# Patient Record
Sex: Female | Born: 1975 | Race: Black or African American | Hispanic: No | Marital: Married | Smoking: Never smoker
Health system: Southern US, Community
[De-identification: ages and names within clinical notes are randomized; demographics above are authoritative.]

## PROBLEM LIST (undated history)

## (undated) DIAGNOSIS — R0602 Shortness of breath: Secondary | ICD-10-CM

## (undated) DIAGNOSIS — M2550 Pain in unspecified joint: Secondary | ICD-10-CM

## (undated) DIAGNOSIS — G43909 Migraine, unspecified, not intractable, without status migrainosus: Secondary | ICD-10-CM

## (undated) DIAGNOSIS — E7800 Pure hypercholesterolemia, unspecified: Secondary | ICD-10-CM

## (undated) DIAGNOSIS — E119 Type 2 diabetes mellitus without complications: Secondary | ICD-10-CM

## (undated) DIAGNOSIS — T7840XA Allergy, unspecified, initial encounter: Secondary | ICD-10-CM

## (undated) DIAGNOSIS — Z87442 Personal history of urinary calculi: Secondary | ICD-10-CM

## (undated) DIAGNOSIS — M25561 Pain in right knee: Secondary | ICD-10-CM

## (undated) DIAGNOSIS — J189 Pneumonia, unspecified organism: Secondary | ICD-10-CM

## (undated) DIAGNOSIS — J45909 Unspecified asthma, uncomplicated: Secondary | ICD-10-CM

## (undated) DIAGNOSIS — R079 Chest pain, unspecified: Secondary | ICD-10-CM

## (undated) DIAGNOSIS — I10 Essential (primary) hypertension: Secondary | ICD-10-CM

## (undated) DIAGNOSIS — M549 Dorsalgia, unspecified: Secondary | ICD-10-CM

## (undated) DIAGNOSIS — K219 Gastro-esophageal reflux disease without esophagitis: Secondary | ICD-10-CM

## (undated) DIAGNOSIS — F319 Bipolar disorder, unspecified: Secondary | ICD-10-CM

## (undated) DIAGNOSIS — F32A Depression, unspecified: Secondary | ICD-10-CM

## (undated) DIAGNOSIS — F419 Anxiety disorder, unspecified: Secondary | ICD-10-CM

## (undated) DIAGNOSIS — E785 Hyperlipidemia, unspecified: Secondary | ICD-10-CM

## (undated) DIAGNOSIS — K5900 Constipation, unspecified: Secondary | ICD-10-CM

## (undated) DIAGNOSIS — E669 Obesity, unspecified: Secondary | ICD-10-CM

## (undated) HISTORY — DX: Anxiety disorder, unspecified: F41.9

## (undated) HISTORY — DX: Shortness of breath: R06.02

## (undated) HISTORY — DX: Dorsalgia, unspecified: M54.9

## (undated) HISTORY — DX: Pain in right knee: M25.561

## (undated) HISTORY — DX: Pure hypercholesterolemia, unspecified: E78.00

## (undated) HISTORY — DX: Pain in unspecified joint: M25.50

## (undated) HISTORY — DX: Migraine, unspecified, not intractable, without status migrainosus: G43.909

## (undated) HISTORY — DX: Bipolar disorder, unspecified: F31.9

## (undated) HISTORY — DX: Gastro-esophageal reflux disease without esophagitis: K21.9

## (undated) HISTORY — DX: Constipation, unspecified: K59.00

## (undated) HISTORY — PX: OTHER SURGICAL HISTORY: SHX169

## (undated) HISTORY — PX: MOUTH SURGERY: SHX715

## (undated) HISTORY — DX: Chest pain, unspecified: R07.9

## (undated) HISTORY — PX: UPPER GASTROINTESTINAL ENDOSCOPY: SHX188

## (undated) HISTORY — DX: Type 2 diabetes mellitus without complications: E11.9

## (undated) HISTORY — PX: PARTIAL HYSTERECTOMY: SHX80

## (undated) HISTORY — DX: Hyperlipidemia, unspecified: E78.5

## (undated) HISTORY — DX: Depression, unspecified: F32.A

## (undated) HISTORY — DX: Allergy, unspecified, initial encounter: T78.40XA

## (undated) HISTORY — PX: COLONOSCOPY: SHX174

## (undated) HISTORY — DX: Essential (primary) hypertension: I10

---

## 2004-04-02 ENCOUNTER — Other Ambulatory Visit: Payer: Self-pay

## 2004-04-02 ENCOUNTER — Emergency Department: Payer: Self-pay | Admitting: Emergency Medicine

## 2004-07-08 ENCOUNTER — Ambulatory Visit: Payer: Self-pay

## 2004-08-02 ENCOUNTER — Emergency Department: Payer: Self-pay | Admitting: Internal Medicine

## 2005-02-13 ENCOUNTER — Emergency Department: Payer: Self-pay | Admitting: Emergency Medicine

## 2005-04-01 ENCOUNTER — Emergency Department: Payer: Self-pay | Admitting: Emergency Medicine

## 2005-07-19 ENCOUNTER — Emergency Department: Payer: Self-pay | Admitting: Unknown Physician Specialty

## 2005-10-31 ENCOUNTER — Emergency Department: Payer: Self-pay | Admitting: Unknown Physician Specialty

## 2006-10-03 ENCOUNTER — Emergency Department: Payer: Self-pay | Admitting: Emergency Medicine

## 2007-01-25 ENCOUNTER — Emergency Department: Payer: Self-pay | Admitting: Emergency Medicine

## 2007-07-11 ENCOUNTER — Emergency Department: Payer: Self-pay | Admitting: Emergency Medicine

## 2007-10-01 ENCOUNTER — Emergency Department: Payer: Self-pay | Admitting: Emergency Medicine

## 2007-10-04 ENCOUNTER — Emergency Department: Payer: Self-pay | Admitting: Emergency Medicine

## 2007-10-04 ENCOUNTER — Other Ambulatory Visit: Payer: Self-pay

## 2008-01-08 ENCOUNTER — Emergency Department: Payer: Self-pay | Admitting: Emergency Medicine

## 2009-08-09 ENCOUNTER — Emergency Department (HOSPITAL_COMMUNITY): Admission: EM | Admit: 2009-08-09 | Discharge: 2009-08-09 | Payer: Self-pay | Admitting: Emergency Medicine

## 2010-08-22 ENCOUNTER — Emergency Department: Payer: Self-pay | Admitting: Unknown Physician Specialty

## 2010-12-11 ENCOUNTER — Emergency Department: Payer: Self-pay | Admitting: Emergency Medicine

## 2011-01-16 ENCOUNTER — Emergency Department: Payer: Self-pay | Admitting: Emergency Medicine

## 2012-06-08 ENCOUNTER — Emergency Department: Payer: Self-pay | Admitting: Emergency Medicine

## 2012-12-16 ENCOUNTER — Emergency Department: Payer: Self-pay | Admitting: Emergency Medicine

## 2013-02-04 ENCOUNTER — Emergency Department: Payer: Self-pay | Admitting: Emergency Medicine

## 2013-05-10 ENCOUNTER — Encounter (HOSPITAL_COMMUNITY): Payer: Self-pay | Admitting: Emergency Medicine

## 2013-05-10 ENCOUNTER — Emergency Department (HOSPITAL_COMMUNITY)
Admission: EM | Admit: 2013-05-10 | Discharge: 2013-05-10 | Disposition: A | Discharge status: Home / Self Care | Payer: Self-pay | Attending: Emergency Medicine | Admitting: Emergency Medicine

## 2013-05-10 ENCOUNTER — Emergency Department (HOSPITAL_COMMUNITY): Payer: Self-pay

## 2013-05-10 DIAGNOSIS — I10 Essential (primary) hypertension: Secondary | ICD-10-CM | POA: Insufficient documentation

## 2013-05-10 DIAGNOSIS — Z79899 Other long term (current) drug therapy: Secondary | ICD-10-CM | POA: Insufficient documentation

## 2013-05-10 DIAGNOSIS — Z91040 Latex allergy status: Secondary | ICD-10-CM | POA: Insufficient documentation

## 2013-05-10 DIAGNOSIS — IMO0002 Reserved for concepts with insufficient information to code with codable children: Secondary | ICD-10-CM | POA: Insufficient documentation

## 2013-05-10 DIAGNOSIS — J45901 Unspecified asthma with (acute) exacerbation: Secondary | ICD-10-CM | POA: Insufficient documentation

## 2013-05-10 HISTORY — DX: Unspecified asthma, uncomplicated: J45.909

## 2013-05-10 HISTORY — DX: Essential (primary) hypertension: I10

## 2013-05-10 MED ORDER — DEXAMETHASONE SODIUM PHOSPHATE 10 MG/ML IJ SOLN
10.0000 mg | Freq: Once | INTRAMUSCULAR | Status: AC
  Administered 2013-05-10: 10 mg via INTRAMUSCULAR
  Filled 2013-05-10: qty 1

## 2013-05-10 MED ORDER — ALBUTEROL SULFATE (2.5 MG/3ML) 0.083% IN NEBU
5.0000 mg | INHALATION_SOLUTION | Freq: Once | RESPIRATORY_TRACT | Status: AC
  Administered 2013-05-10: 5 mg via RESPIRATORY_TRACT
  Filled 2013-05-10: qty 6

## 2013-05-10 MED ORDER — ALBUTEROL SULFATE (2.5 MG/3ML) 0.083% IN NEBU
2.5000 mg | INHALATION_SOLUTION | Freq: Four times a day (QID) | RESPIRATORY_TRACT | Status: DC | PRN
Start: 2013-05-10 — End: 2020-07-09

## 2013-05-10 MED ORDER — IPRATROPIUM BROMIDE 0.02 % IN SOLN
0.5000 mg | Freq: Once | RESPIRATORY_TRACT | Status: AC
  Administered 2013-05-10: 0.5 mg via RESPIRATORY_TRACT
  Filled 2013-05-10: qty 2.5

## 2013-05-10 MED ORDER — PREDNISONE 20 MG PO TABS: 40.0000 mg | ORAL_TABLET | Freq: Every day | ORAL | Status: DC

## 2013-05-10 MED ORDER — ALBUTEROL SULFATE HFA 108 (90 BASE) MCG/ACT IN AERS
2.0000 | INHALATION_SPRAY | RESPIRATORY_TRACT | Status: DC | PRN
  Administered 2013-05-10: 2 via RESPIRATORY_TRACT
  Filled 2013-05-10: qty 6.7

## 2013-05-10 NOTE — ED Provider Notes (Signed)
Medical screening examination/treatment/procedure(s) were performed by non-physician practitioner and as supervising physician I was immediately available for consultation/collaboration.     Leonidus Rowand, MD 05/10/13 2341 

## 2013-05-10 NOTE — ED Provider Notes (Signed)
CSN: [id number]     Arrival date & time 05/10/13  1533 History  This chart was scribed for non-physician practitioner Junius FinnerErin O'Malley, PA-C working with Geoffery Lyonsouglas Delo, MD by Joaquin MusicKristina Sanchez-Matthews, ED Scribe. This patient was seen in room TR09C/TR09C and the patient's care was started at 7:11 PM .   Chief Complaint  Patient presents with  . Shortness of Breath   The history is provided by the patient. No language interpreter was used.   HPI Comments: Gloria Kim is a 38 y.o. female with a hx of asthma who presents to the Emergency Department complaining of SOB that began earlier today . Pt states she generally is SOB when the weather changes and states this happens yearly. She states she has been having a dry cough with little sputum. Pt states she is currently out of her nebulizer medication but states she has been using ProAir without relief. She reports having swelling in her L foot.Pt denies hx of blood clots. States she was evaluated last month with blood tests and ultrasound for blood clots in left leg but everything came back normal. [facility]. Pt denies nausea, vomiting, and diarrhea. Denies sick contacts or recent travel.   Past Medical History  Diagnosis Date  . Asthma   . Hypertension    History reviewed. No pertinent past surgical history. No family history on file. History  Substance Use Topics  . Smoking status: Never Smoker   . Smokeless tobacco: Not on file  . Alcohol Use: Yes   OB History   Grav Para Term Preterm Abortions TAB SAB Ect Mult Living                 Review of Systems  Respiratory: Positive for cough and shortness of breath.   Gastrointestinal: Negative for nausea, vomiting and diarrhea.  All other systems reviewed and are negative.   Allergies  Latex  Home Medications   Current Outpatient Rx  Name  Route  Sig  Dispense  Refill  . albuterol (PROVENTIL HFA;VENTOLIN HFA) 108 (90 BASE) MCG/ACT inhaler   Inhalation  Inhale 2 puffs into the lungs every 6 (six) hours as needed for wheezing or shortness of breath.         . lisinopril (PRINIVIL,ZESTRIL) 5 MG tablet   Oral   Take 5 mg by mouth daily.         Marland Kitchen. albuterol (PROVENTIL) (2.5 MG/3ML) 0.083% nebulizer solution   Nebulization   Take 3 mLs (2.5 mg total) by nebulization every 6 (six) hours as needed for wheezing or shortness of breath.   75 mL   12   . predniSONE (DELTASONE) 20 MG tablet   Oral   Take 2 tablets (40 mg total) by mouth daily.   10 tablet   0     BP 150/83  Pulse 106  Temp(Src) 98.3 F (36.8 C) (Oral)  Resp 20  SpO2 96%  LMP 05/10/2013  Physical Exam  Nursing note and vitals reviewed. Constitutional: She appears well-developed and well-nourished. No distress.  HENT:  Head: Normocephalic and atraumatic.  Eyes: Conjunctivae are normal. No scleral icterus.  Neck: Normal range of motion.  Cardiovascular: Normal rate, regular rhythm and normal heart sounds.   Pulmonary/Chest: Effort normal. No respiratory distress. She has wheezes. She has no rales. She exhibits no tenderness.  Diffused wheezes greater in the L than the R. No respiratory distress. Able to speak in full sentences.  Abdominal: Soft. Bowel sounds are normal. She  exhibits no distension and no mass. There is no tenderness. There is no rebound and no guarding.  Musculoskeletal: Normal range of motion.  Neurological: She is alert.  Skin: Skin is warm and dry. She is not diaphoretic.    ED Course  Procedures DIAGNOSTIC STUDIES: Oxygen Saturation is 95% on RA, normal by my interpretation.    COORDINATION OF CARE: 5:19 PM-Discussed treatment plan which includes X-ray pt. Will discharge pt with albuterol neb and prednisone. Pt agreed to plan.   5:53 PM-Evaluated breathing of pt and pt states she is feeling better after 2nd neb tx. Will discharge pt with medications. Pt agreed to plan.  Labs Review Labs Reviewed - No data to display Imaging Review Dg  Chest 2 View  05/10/2013   CLINICAL DATA:  Shortness of breath 1 week.  Asthma.  Chest pain.  EXAM: CHEST  2 VIEW  COMPARISON:  None.  FINDINGS: The heart size is exaggerated by low lung volumes. No focal airspace disease is evident. The visualized soft tissues and bony thorax are unremarkable.  IMPRESSION: 1. Low lung volumes. 2. No acute cardiopulmonary disease.   Electronically Signed   By: Gennette Pac M.D.   On: 05/10/2013 17:48    EKG Interpretation   None      MDM   1. Asthma exacerbation    Pt with hx of asthma exacerbation. Pt improved after 2 neb tx and decadron. CXR: unremarkable. WIll discharge home. Rx: prednisone and albuterol. Return precautions provided. Pt verbalized understanding and agreement with tx plan.   I personally performed the services described in this documentation, which was scribed in my presence. The recorded information has been reviewed and is accurate.    Junius Finner, PA-C 05/10/13 1914

## 2013-05-10 NOTE — ED Notes (Signed)
The pt is an asthmatic and has had sob for 7 days.  She has a hhn machine but she is out of the med for it.  She has been coughing and sneezing

## 2013-12-25 ENCOUNTER — Emergency Department (HOSPITAL_COMMUNITY)
Admission: EM | Admit: 2013-12-25 | Discharge: 2013-12-25 | Disposition: A | Discharge status: Home / Self Care | Payer: Self-pay | Attending: Emergency Medicine | Admitting: Emergency Medicine

## 2013-12-25 ENCOUNTER — Encounter (HOSPITAL_COMMUNITY): Payer: Self-pay | Admitting: Emergency Medicine

## 2013-12-25 ENCOUNTER — Emergency Department (HOSPITAL_COMMUNITY): Payer: Self-pay

## 2013-12-25 DIAGNOSIS — R0602 Shortness of breath: Secondary | ICD-10-CM | POA: Insufficient documentation

## 2013-12-25 DIAGNOSIS — J45901 Unspecified asthma with (acute) exacerbation: Secondary | ICD-10-CM | POA: Insufficient documentation

## 2013-12-25 DIAGNOSIS — J4521 Mild intermittent asthma with (acute) exacerbation: Secondary | ICD-10-CM

## 2013-12-25 DIAGNOSIS — Z91040 Latex allergy status: Secondary | ICD-10-CM | POA: Insufficient documentation

## 2013-12-25 DIAGNOSIS — Z79899 Other long term (current) drug therapy: Secondary | ICD-10-CM | POA: Insufficient documentation

## 2013-12-25 DIAGNOSIS — I10 Essential (primary) hypertension: Secondary | ICD-10-CM | POA: Insufficient documentation

## 2013-12-25 DIAGNOSIS — J189 Pneumonia, unspecified organism: Secondary | ICD-10-CM

## 2013-12-25 DIAGNOSIS — J159 Unspecified bacterial pneumonia: Secondary | ICD-10-CM | POA: Insufficient documentation

## 2013-12-25 MED ORDER — PREDNISONE 50 MG PO TABS: 50.0000 mg | ORAL_TABLET | Freq: Every day | ORAL | Status: DC

## 2013-12-25 MED ORDER — LIDOCAINE HCL (PF) 1 % IJ SOLN
5.0000 mL | Freq: Once | INTRAMUSCULAR | Status: AC
  Administered 2013-12-25: 2.1 mL
  Filled 2013-12-25: qty 5

## 2013-12-25 MED ORDER — AZITHROMYCIN 250 MG PO TABS
500.0000 mg | ORAL_TABLET | Freq: Once | ORAL | Status: AC
  Administered 2013-12-25: 500 mg via ORAL
  Filled 2013-12-25: qty 2

## 2013-12-25 MED ORDER — PREDNISONE 20 MG PO TABS
60.0000 mg | ORAL_TABLET | Freq: Once | ORAL | Status: AC
  Administered 2013-12-25: 60 mg via ORAL
  Filled 2013-12-25: qty 3

## 2013-12-25 MED ORDER — DOXYCYCLINE HYCLATE 100 MG PO CAPS: 100.0000 mg | ORAL_CAPSULE | Freq: Two times a day (BID) | ORAL | Status: AC

## 2013-12-25 MED ORDER — ALBUTEROL SULFATE HFA 108 (90 BASE) MCG/ACT IN AERS
6.0000 | INHALATION_SPRAY | Freq: Once | RESPIRATORY_TRACT | Status: AC
  Administered 2013-12-25: 6 via RESPIRATORY_TRACT
  Filled 2013-12-25: qty 6.7

## 2013-12-25 MED ORDER — ALBUTEROL SULFATE HFA 108 (90 BASE) MCG/ACT IN AERS: 2.0000 | INHALATION_SPRAY | Freq: Four times a day (QID) | RESPIRATORY_TRACT | Status: DC | PRN

## 2013-12-25 MED ORDER — CEFTRIAXONE SODIUM 1 G IJ SOLR
1.0000 g | Freq: Once | INTRAMUSCULAR | Status: AC
Start: 2013-12-25 — End: 2013-12-25
  Administered 2013-12-25: 1 g via INTRAMUSCULAR
  Filled 2013-12-25: qty 10

## 2013-12-25 NOTE — ED Notes (Signed)
Pt remains monitored by blood pressure, pulse ox, and 5 lead. Pts daughter remains at bedside.  

## 2013-12-25 NOTE — ED Notes (Signed)
The pt haS HAD COLD CHILLS SINCE THE WEEKEND  SHE IS AN ASTHMATIC AND SHE STARTED HAVING MORE DIFFICULTY BREATHING 2 DAYTS AGO WITH A COUGH ANS SHE THINKS SHE HAS HAD A TEMP.  PRODUCTIVE COUGH WHITE THIN.  LMP LAST MONTH

## 2013-12-25 NOTE — ED Provider Notes (Signed)
I saw and evaluated the patient, reviewed the resident's note and I agree with the findings and plan.   EKG Interpretation None     Area of the patient and find that she is in stable condition she does not have any acute respiratory distress. Her mental status is clear she is speaking easily in full sentences. She does have expiratory wheeze bilaterally. The patient does describe her history of asthma. Today however she has productive cough and pleuritic chest pain with fever. Chest x-ray does confirm a area suspicious for a focal pneumonia. She will be treated for outpatient community acquired pneumonia. Currently she does appear stable and without respiratory symptoms of significant asthma exacerbation but I think would necessitate any admission. The patient has been counseled however if there should be any worsening or changing in the severity or quality of her shortness of breath or symptoms she is to return for reevaluation. Impression Asthma Community acquired pneumonia  Arby Barrette, MD 12/25/13 2125

## 2013-12-25 NOTE — ED Provider Notes (Signed)
CSN: [id number]     Arrival date & time 12/25/13  1603 History   First MD Initiated Contact with Patient 12/25/13 1948     Chief Complaint  Patient presents with  . Shortness of Breath    Patient is a 38 y.o. female presenting with shortness of breath. The history is provided by the patient.  Shortness of Breath Severity:  Mild Onset quality:  Gradual Duration:  4 days Timing:  Constant Progression:  Worsening Chronicity:  New Context: activity   Context: not URI   Relieved by:  Nothing Worsened by:  Deep breathing Ineffective treatments:  Inhaler Associated symptoms: cough (new white productive sputum), fever (101 F orally at home), sputum production and wheezing   Associated symptoms: no abdominal pain, no chest pain, no diaphoresis, no hemoptysis, no sore throat, no syncope and no vomiting      Past Medical History  Diagnosis Date  . Asthma   . Hypertension    History reviewed. No pertinent past surgical history. No family history on file. History  Substance Use Topics  . Smoking status: Never Smoker   . Smokeless tobacco: Not on file  . Alcohol Use: Yes   OB History   Grav Para Term Preterm Abortions TAB SAB Ect Mult Living                 Review of Systems  Constitutional: Positive for fever (101 F orally at home). Negative for diaphoresis.  HENT: Negative for sore throat.   Respiratory: Positive for cough (new white productive sputum), sputum production, shortness of breath and wheezing. Negative for hemoptysis.   Cardiovascular: Negative for chest pain, leg swelling and syncope.  Gastrointestinal: Negative for vomiting and abdominal pain.  Musculoskeletal: Positive for myalgias. Negative for arthralgias and back pain.      Allergies  Latex  Home Medications   Prior to Admission medications   Medication Sig Start Date End Date Taking? Authorizing Provider  albuterol (PROVENTIL HFA;VENTOLIN HFA) 108 (90 BASE) MCG/ACT inhaler Inhale 2 puffs into the  lungs every 6 (six) hours as needed for wheezing or shortness of breath.   Yes Historical Provider, MD  albuterol (PROVENTIL) (2.5 MG/3ML) 0.083% nebulizer solution Take 3 mLs (2.5 mg total) by nebulization every 6 (six) hours as needed for wheezing or shortness of breath. 05/10/13  Yes Junius Finner, PA-C  lisinopril (PRINIVIL,ZESTRIL) 5 MG tablet Take 5 mg by mouth daily.   Yes Historical Provider, MD   BP 142/90  Pulse 106  Temp(Src) 97.1 F (36.2 C) (Oral)  Resp 22  Ht  (1.575 m)  Wt 275 lb (124.739 kg)  BMI 50.29 kg/m2  SpO2 96%  LMP 11/24/2013 Physical Exam  Nursing note and vitals reviewed. Constitutional: She is oriented to person, place, and time. She appears well-developed and well-nourished. No distress.  HENT:  Head: Normocephalic and atraumatic.  Nose: Nose normal.  Mouth/Throat: Oropharynx is clear and moist. No oropharyngeal exudate.  Eyes: Conjunctivae are normal. Pupils are equal, round, and reactive to light. No scleral icterus.  Neck: Normal range of motion. Neck supple. No tracheal deviation present.  Cardiovascular: Regular rhythm and normal heart sounds.   No murmur heard. 100 bpm  Pulmonary/Chest: Effort normal. No respiratory distress. She has wheezes (end exp at [facility]). She has rales (LLL).  Abdominal: Soft. Bowel sounds are normal. She exhibits no distension and no mass. There is no tenderness.  Musculoskeletal: Normal range of motion. She exhibits no edema.  No lower extremity edema, erythema,  warmth or TTP  Neurological: She is alert and oriented to person, place, and time.  Skin: Skin is warm and dry. No rash noted.  Psychiatric: She has a normal mood and affect.    ED Course  Procedures (including critical care time) Labs Review Labs Reviewed - No data to display  Imaging Review Dg Chest 2 View  12/25/2013   CLINICAL DATA:  Asthma, shortness of breath, cough  EXAM: CHEST  2 VIEW  COMPARISON:  05/10/2013  FINDINGS: Heart size upper normal to  mildly enlarged but stable. Vascular pattern within normal limits. Right lung is clear. On the left, there is a band of opacity in the lateral lower lobe which is new from the prior study. This is seen posteriorly on the lateral radiograph.  IMPRESSION: Focus of left lower lobe consolidation likely representing subsegmental atelectasis. Developing pneumonia is difficult to exclude. Correlate clinically and consider radiographic followup if indicated.   Electronically Signed   By: Esperanza Heir M.D.   On: 12/25/2013 17:43     EKG Interpretation None      MDM   Final diagnoses:  CAP (community acquired pneumonia)  Asthma, mild intermittent, with acute exacerbation    Known hx of asthma.  Not in resp distress. Not hypoxic. No signs of DVT, no hemoptysis. Primary diagnosis of asthma exacerbation complicated by infectious process more likely than PE.  VSS with exception of borderline tachycardia. Pt well appearing.   No hx of CAD, hx not consistent with ACS. Treat for outpatient CAP.  Given Rocephin and azithromycin here.  D/c with doxy 7 day course (LMP 1 month ago, pt refuses urine preg screen, pt understands risk says there is no way she is pregnant). Strict return precautions given. Pt expresses understanding. F/u with PCP in 2 days  Medications  albuterol (PROVENTIL HFA;VENTOLIN HFA) 108 (90 BASE) MCG/ACT inhaler 6 puff (6 puffs Inhalation Given 12/25/13 2013)  predniSONE (DELTASONE) tablet 60 mg (60 mg Oral Given 12/25/13 2050)  cefTRIAXone (ROCEPHIN) injection 1 g (1 g Intramuscular Given 12/25/13 2237)  azithromycin (ZITHROMAX) tablet 500 mg (500 mg Oral Given 12/25/13 2236)  lidocaine (PF) (XYLOCAINE) 1 % injection 5 mL (2.1 mLs Other Given 12/25/13 2237)     Sofie Rower, MD 12/25/13 2310

## 2013-12-25 NOTE — ED Notes (Signed)
Pt remains monitored by blood pressure, pulse ox, and 5 lead. Pts daughter remains at bedside.

## 2013-12-25 NOTE — ED Notes (Signed)
Patient discharged with all personal belongings. Escorted patient out without any incident.

## 2014-08-15 ENCOUNTER — Encounter (HOSPITAL_COMMUNITY): Payer: Self-pay | Admitting: Emergency Medicine

## 2014-08-15 ENCOUNTER — Emergency Department (HOSPITAL_COMMUNITY)
Admission: EM | Admit: 2014-08-15 | Discharge: 2014-08-16 | Disposition: A | Discharge status: Home / Self Care | Payer: Self-pay | Attending: Emergency Medicine | Admitting: Emergency Medicine

## 2014-08-15 DIAGNOSIS — Z91040 Latex allergy status: Secondary | ICD-10-CM | POA: Insufficient documentation

## 2014-08-15 DIAGNOSIS — J45909 Unspecified asthma, uncomplicated: Secondary | ICD-10-CM | POA: Insufficient documentation

## 2014-08-15 DIAGNOSIS — R197 Diarrhea, unspecified: Secondary | ICD-10-CM | POA: Insufficient documentation

## 2014-08-15 DIAGNOSIS — I10 Essential (primary) hypertension: Secondary | ICD-10-CM | POA: Insufficient documentation

## 2014-08-15 DIAGNOSIS — N12 Tubulo-interstitial nephritis, not specified as acute or chronic: Secondary | ICD-10-CM | POA: Insufficient documentation

## 2014-08-15 DIAGNOSIS — Z79899 Other long term (current) drug therapy: Secondary | ICD-10-CM | POA: Insufficient documentation

## 2014-08-15 DIAGNOSIS — E669 Obesity, unspecified: Secondary | ICD-10-CM | POA: Insufficient documentation

## 2014-08-15 DIAGNOSIS — Z7952 Long term (current) use of systemic steroids: Secondary | ICD-10-CM | POA: Insufficient documentation

## 2014-08-15 HISTORY — DX: Obesity, unspecified: E66.9

## 2014-08-15 LAB — COMPREHENSIVE METABOLIC PANEL
ALBUMIN: 3.3 g/dL — AB (ref 3.5–5.2)
ALK PHOS: 69 U/L (ref 39–117)
ALT: 20 U/L (ref 0–35)
AST: 24 U/L (ref 0–37)
Anion gap: 10 (ref 5–15)
BUN: 10 mg/dL (ref 6–23)
CO2: 27 mmol/L (ref 19–32)
Calcium: 9.1 mg/dL (ref 8.4–10.5)
Chloride: 102 mmol/L (ref 96–112)
Creatinine, Ser: 0.83 mg/dL (ref 0.50–1.10)
GFR calc Af Amer: >90 mL/min (ref >90)
GFR, EST NON AFRICAN AMERICAN: 88 mL/min — AB (ref >90)
GLUCOSE: 196 mg/dL — AB (ref 70–99)
POTASSIUM: 4.3 mmol/L (ref 3.5–5.1)
SODIUM: 139 mmol/L (ref 135–145)
Total Bilirubin: 0.6 mg/dL (ref 0.3–1.2)
Total Protein: 7 g/dL (ref 6.0–8.3)

## 2014-08-15 LAB — URINALYSIS, ROUTINE W REFLEX MICROSCOPIC
GLUCOSE, UA: NEGATIVE mg/dL
Ketones, ur: 15 mg/dL — AB
Nitrite: POSITIVE — AB
Protein, ur: NEGATIVE mg/dL
Specific Gravity, Urine: 1.022 (ref 1.005–1.030)
UROBILINOGEN UA: 1 mg/dL (ref 0.0–1.0)
pH: 6 (ref 5.0–8.0)

## 2014-08-15 LAB — CBC WITH DIFFERENTIAL/PLATELET
Basophils Absolute: 0.1 10*3/uL (ref 0.0–0.1)
Basophils Relative: 1 % (ref 0–1)
EOS ABS: 0.2 10*3/uL (ref 0.0–0.7)
Eosinophils Relative: 2 % (ref 0–5)
HEMATOCRIT: 38 % (ref 36.0–46.0)
HEMOGLOBIN: 11.9 g/dL — AB (ref 12.0–15.0)
LYMPHS ABS: 4.2 10*3/uL — AB (ref 0.7–4.0)
LYMPHS PCT: 46 % (ref 12–46)
MCH: 28.6 pg (ref 26.0–34.0)
MCHC: 31.3 g/dL (ref 30.0–36.0)
MCV: 91.3 fL (ref 78.0–100.0)
MONO ABS: 0.4 10*3/uL (ref 0.1–1.0)
Monocytes Relative: 5 % (ref 3–12)
NEUTROS ABS: 4.3 10*3/uL (ref 1.7–7.7)
NEUTROS PCT: 46 % (ref 43–77)
Platelets: 262 10*3/uL (ref 150–400)
RBC: 4.16 MIL/uL (ref 3.87–5.11)
RDW: 14.5 % (ref 11.5–15.5)
WBC: 9.2 10*3/uL (ref 4.0–10.5)

## 2014-08-15 LAB — URINE MICROSCOPIC-ADD ON

## 2014-08-15 LAB — POC URINE PREG, ED: Preg Test, Ur: NEGATIVE

## 2014-08-15 NOTE — ED Notes (Signed)
Pt. reports right low back  pain with dysuria and hematuria onset this week , denies fever or chills.

## 2014-08-15 NOTE — ED Provider Notes (Signed)
CSN: [id number]     Arrival date & time 08/15/14  2130 History  This chart was scribed for Mirian Mo, MD by Phillis Haggis, ED Scribe. This patient was seen in room D30C/D30C and patient care was started at 12:11 AM.    Chief Complaint  Patient presents with  . Back Pain  . Dysuria  . Hematuria   Patient is a 39 y.o. female presenting with back pain, dysuria, and hematuria. The history is provided by the patient. No language interpreter was used.  Back Pain Location:  Lumbar spine Duration:  1 week Timing:  Constant Associated symptoms: dysuria   Associated symptoms: no fever   Dysuria Associated symptoms: nausea   Associated symptoms: no fever, no vaginal discharge and no vomiting   Hematuria   HPI Comments: Gloria Kim is a 39 y.o. female who presents to the Emergency Department complaining of right lower back pain, dysuria and hematuria onset two weeks ago. She reports a lot of pressure in her abdomen, and states that she feels like she has to pee but cannot. She states that it feels like a UTI or kidney infection. She reports associated nausea and diarrhea when trying to make herself pee.She states that the pain was waxing and waning but is now constant. She states that she was taking old anti-biotics for treatment. She states that she has recently found blood specks in her urine. She states that she found blood in her underwear but is not sure if that is related to her period or the hematuria. Patient denies fever, vomiting, constipation, vaginal discharge or chills.   Past Medical History  Diagnosis Date  . Asthma   . Hypertension   . Obesity    Past Surgical History  Procedure Laterality Date  . Partial hysterectomy     No family history on file. History  Substance Use Topics  . Smoking status: Never Smoker   . Smokeless tobacco: Not on file  . Alcohol Use: Yes   OB History    No data available     Review of Systems  Constitutional: Negative for  fever and chills.  Gastrointestinal: Positive for nausea and diarrhea. Negative for vomiting and constipation.  Genitourinary: Positive for dysuria and hematuria. Negative for vaginal discharge.  Musculoskeletal: Positive for back pain.  All other systems reviewed and are negative.  Allergies  Latex  Home Medications   Prior to Admission medications   Medication Sig Start Date End Date Taking? Authorizing Provider  albuterol (PROVENTIL HFA;VENTOLIN HFA) 108 (90 BASE) MCG/ACT inhaler Inhale 2 puffs into the lungs every 6 (six) hours as needed for wheezing or shortness of breath. 12/25/13   Sofie Rower, MD  albuterol (PROVENTIL) (2.5 MG/3ML) 0.083% nebulizer solution Take 3 mLs (2.5 mg total) by nebulization every 6 (six) hours as needed for wheezing or shortness of breath. 05/10/13   Junius Finner, PA-C  cephALEXin (KEFLEX) 500 MG capsule Take 1 capsule (500 mg total) by mouth 4 (four) times daily. 08/16/14   Mirian Mo, MD  lisinopril (PRINIVIL,ZESTRIL) 5 MG tablet Take 5 mg by mouth daily.    Historical Provider, MD  predniSONE (DELTASONE) 50 MG tablet Take 1 tablet (50 mg total) by mouth daily with breakfast. 12/25/13   Sofie Rower, MD   BP 163/82 mmHg  Pulse 92  Temp(Src) 98.2 F (36.8 C) (Oral)  Resp 14  SpO2 94%  LMP 08/15/2014 Physical Exam  Constitutional: She is oriented to person, place, and time. She appears well-developed and well-nourished.  HENT:  Head: Normocephalic and atraumatic.  Right Ear: External ear normal.  Left Ear: External ear normal.  Eyes: Conjunctivae and EOM are normal. Pupils are equal, round, and reactive to light.  Neck: Normal range of motion. Neck supple.  Cardiovascular: Normal rate, regular rhythm, normal heart sounds and intact distal pulses.   Pulmonary/Chest: Effort normal and breath sounds normal.  Abdominal: Soft. Bowel sounds are normal. There is tenderness in the suprapubic area. There is CVA tenderness (R).  Genitourinary: Cervix  exhibits discharge (blood). Cervix exhibits no motion tenderness and no friability. Right adnexum displays no tenderness. Left adnexum displays no tenderness.  Musculoskeletal: Normal range of motion.  Neurological: She is alert and oriented to person, place, and time.  Skin: Skin is warm and dry.  Vitals reviewed.   ED Course  Procedures (including critical care time) DIAGNOSTIC STUDIES: Oxygen Saturation is 94% on room air, adequate by my interpretation.    COORDINATION OF CARE: 12:14 AM-Discussed treatment plan which includes pelvic exam with pt at bedside and pt agreed to plan.   Labs Review Labs Reviewed  WET PREP, GENITAL - Abnormal; Notable for the following:    Clue Cells Wet Prep HPF POC FEW (*)    WBC, Wet Prep HPF POC FEW (*)    All other components within normal limits  URINALYSIS, ROUTINE W REFLEX MICROSCOPIC - Abnormal; Notable for the following:    Color, Urine AMBER (*)    APPearance CLOUDY (*)    Hgb urine dipstick LARGE (*)    Bilirubin Urine SMALL (*)    Ketones, ur 15 (*)    Nitrite POSITIVE (*)    Leukocytes, UA MODERATE (*)    All other components within normal limits  CBC WITH DIFFERENTIAL/PLATELET - Abnormal; Notable for the following:    Hemoglobin 11.9 (*)    Lymphs Abs 4.2 (*)    All other components within normal limits  COMPREHENSIVE METABOLIC PANEL - Abnormal; Notable for the following:    Glucose, Bld 196 (*)    Albumin 3.3 (*)    GFR calc non Af Amer 88 (*)    All other components within normal limits  URINE MICROSCOPIC-ADD ON  LIPASE, BLOOD  HIV ANTIBODY (ROUTINE TESTING)  RPR  PREGNANCY, URINE  POC URINE PREG, ED  GC/CHLAMYDIA PROBE AMP ([place])    Imaging Review No results found.   EKG Interpretation None      MDM   Final diagnoses:  Pyelonephritis    39 y.o. female with pertinent PMH of asthma, HTN presents with vaginal bleeding, dysuria, and abd pain as above.  Pt states symptoms are similar to prior  UTI/pyelonephritis.  LMP 2 years ago.    Exam as above.  Rocephin given.  Likely early pyelonephritis.  Do not feel appendicitis or other emergent intrabdominal pathology likely.  DC home to fu with womens and PCP.  I have reviewed all laboratory and imaging studies if ordered as above  1. Pyelonephritis          Mirian MoMatthew Gentry, MD 08/16/14 [phone]

## 2014-08-16 LAB — RPR: RPR: NONREACTIVE

## 2014-08-16 LAB — WET PREP, GENITAL
Trich, Wet Prep: NONE SEEN
YEAST WET PREP: NONE SEEN

## 2014-08-16 LAB — HIV ANTIBODY (ROUTINE TESTING W REFLEX): HIV SCREEN 4TH GENERATION: NONREACTIVE

## 2014-08-16 LAB — LIPASE, BLOOD: Lipase: 23 U/L (ref 11–59)

## 2014-08-16 MED ORDER — DEXTROSE 5 % IV SOLN
1.0000 g | Freq: Once | INTRAVENOUS | Status: DC
  Filled 2014-08-16: qty 10

## 2014-08-16 MED ORDER — CEPHALEXIN 500 MG PO CAPS: 500.0000 mg | ORAL_CAPSULE | Freq: Four times a day (QID) | ORAL | Status: DC

## 2014-08-16 MED ORDER — SODIUM CHLORIDE 0.9 % IV BOLUS (SEPSIS): 1000.0000 mL | Freq: Once | INTRAVENOUS | Status: DC

## 2014-08-16 MED ORDER — DEXTROSE 5 % IV SOLN
1.0000 g | Freq: Once | INTRAVENOUS | Status: AC
  Administered 2014-08-16: 1 g via INTRAVENOUS

## 2014-08-16 MED ORDER — CEFTRIAXONE SODIUM 1 G IJ SOLR
1.0000 g | Freq: Once | INTRAMUSCULAR | Status: DC
Start: 2014-08-16 — End: 2014-08-16

## 2014-08-16 NOTE — ED Notes (Signed)
MD at bedside. 

## 2014-08-16 NOTE — ED Notes (Signed)
Dr. Littie DeedsGentry advised that since patient now has IV, can administer iv rocephin

## 2014-08-16 NOTE — ED Notes (Signed)
Pt states she has had painful urination x 2 weeks, tried to treat at home with cranberry juice, azo and an antibiotic that she had left over from previous uti. Pain now radiating to her back. Pt reports mild nausea and some diarrhea. Denies fever.

## 2014-08-16 NOTE — Discharge Instructions (Signed)
Pyelonephritis, Adult °Pyelonephritis is a kidney infection. In general, there are 2 main types of pyelonephritis: °· Infections that come on quickly without any warning (acute pyelonephritis). °· Infections that persist for a long period of time (chronic pyelonephritis). °CAUSES  °Two main causes of pyelonephritis are: °· Bacteria traveling from the bladder to the kidney. This is a problem especially in pregnant women. The urine in the bladder can become filled with bacteria from multiple causes, including: °¨ Inflammation of the prostate gland (prostatitis). °¨ Sexual intercourse in females. °¨ Bladder infection (cystitis). °· Bacteria traveling from the bloodstream to the tissue part of the kidney. °Problems that may increase your risk of getting a kidney infection include: °· Diabetes. °· Kidney stones or bladder stones. °· Cancer. °· Catheters placed in the bladder. °· Other abnormalities of the kidney or ureter. °SYMPTOMS  °· Abdominal pain. °· Pain in the side or flank area. °· Fever. °· Chills. °· Upset stomach. °· Blood in the urine (dark urine). °· Frequent urination. °· Strong or persistent urge to urinate. °· Burning or stinging when urinating. °DIAGNOSIS  °Your caregiver may diagnose your kidney infection based on your symptoms. A urine sample may also be taken. °TREATMENT  °In general, treatment depends on how severe the infection is.  °· If the infection is mild and caught early, your caregiver may treat you with oral antibiotics and send you home. °· If the infection is more severe, the bacteria may have gotten into the bloodstream. This will require intravenous (IV) antibiotics and a hospital stay. Symptoms may include: °¨ High fever. °¨ Severe flank pain. °¨ Shaking chills. °· Even after a hospital stay, your caregiver may require you to be on oral antibiotics for a period of time. °· Other treatments may be required depending upon the cause of the infection. °HOME CARE INSTRUCTIONS  °· Take your  antibiotics as directed. Finish them even if you start to feel better. °· Make an appointment to have your urine checked to make sure the infection is gone. °· Drink enough fluids to keep your urine clear or pale yellow. °· Take medicines for the bladder if you have urgency and frequency of urination as directed by your caregiver. °SEEK IMMEDIATE MEDICAL CARE IF:  °· You have a fever or persistent symptoms for more than 2-3 days. °· You have a fever and your symptoms suddenly get worse. °· You are unable to take your antibiotics or fluids. °· You develop shaking chills. °· You experience extreme weakness or fainting. °· There is no improvement after 2 days of treatment. °MAKE SURE YOU: °· Understand these instructions. °· Will watch your condition. °· Will get help right away if you are not doing well or get worse. °Document Released: 04/04/2005 Document Revised: 10/04/2011 Document Reviewed: 09/08/2010 °[employer]® Patient Information ©2015 [employer]. This information is not intended to replace advice given to you by your health care provider. Make sure you discuss any questions you have with your health care provider. ° °

## 2014-08-18 LAB — GC/CHLAMYDIA PROBE AMP (~~LOC~~) NOT AT ARMC
Chlamydia: NEGATIVE
Neisseria Gonorrhea: NEGATIVE

## 2014-09-08 ENCOUNTER — Encounter (HOSPITAL_COMMUNITY): Payer: Self-pay | Admitting: Family Medicine

## 2014-09-08 ENCOUNTER — Emergency Department (HOSPITAL_COMMUNITY)
Admission: EM | Admit: 2014-09-08 | Discharge: 2014-09-08 | Disposition: A | Discharge status: Home / Self Care | Payer: Self-pay | Attending: Emergency Medicine | Admitting: Emergency Medicine

## 2014-09-08 ENCOUNTER — Emergency Department (HOSPITAL_COMMUNITY): Payer: Self-pay

## 2014-09-08 DIAGNOSIS — R112 Nausea with vomiting, unspecified: Secondary | ICD-10-CM

## 2014-09-08 DIAGNOSIS — I10 Essential (primary) hypertension: Secondary | ICD-10-CM | POA: Insufficient documentation

## 2014-09-08 DIAGNOSIS — R109 Unspecified abdominal pain: Secondary | ICD-10-CM

## 2014-09-08 DIAGNOSIS — Z3202 Encounter for pregnancy test, result negative: Secondary | ICD-10-CM | POA: Insufficient documentation

## 2014-09-08 DIAGNOSIS — J45909 Unspecified asthma, uncomplicated: Secondary | ICD-10-CM | POA: Insufficient documentation

## 2014-09-08 DIAGNOSIS — N200 Calculus of kidney: Secondary | ICD-10-CM | POA: Insufficient documentation

## 2014-09-08 DIAGNOSIS — Z91040 Latex allergy status: Secondary | ICD-10-CM | POA: Insufficient documentation

## 2014-09-08 DIAGNOSIS — R739 Hyperglycemia, unspecified: Secondary | ICD-10-CM | POA: Insufficient documentation

## 2014-09-08 DIAGNOSIS — E669 Obesity, unspecified: Secondary | ICD-10-CM | POA: Insufficient documentation

## 2014-09-08 DIAGNOSIS — Z79899 Other long term (current) drug therapy: Secondary | ICD-10-CM | POA: Insufficient documentation

## 2014-09-08 LAB — COMPREHENSIVE METABOLIC PANEL
ALK PHOS: 59 U/L (ref 38–126)
ALT: 16 U/L (ref 14–54)
AST: 23 U/L (ref 15–41)
Albumin: 3.4 g/dL — ABNORMAL LOW (ref 3.5–5.0)
Anion gap: 8 (ref 5–15)
BUN: 8 mg/dL (ref 6–20)
CO2: 25 mmol/L (ref 22–32)
CREATININE: 0.85 mg/dL (ref 0.44–1.00)
Calcium: 8.9 mg/dL (ref 8.9–10.3)
Chloride: 105 mmol/L (ref 101–111)
Glucose, Bld: 205 mg/dL — ABNORMAL HIGH (ref 65–99)
Potassium: 4 mmol/L (ref 3.5–5.1)
Sodium: 138 mmol/L (ref 135–145)
TOTAL PROTEIN: 7.1 g/dL (ref 6.5–8.1)
Total Bilirubin: 0.5 mg/dL (ref 0.3–1.2)

## 2014-09-08 LAB — URINALYSIS, ROUTINE W REFLEX MICROSCOPIC
Bilirubin Urine: NEGATIVE
GLUCOSE, UA: NEGATIVE mg/dL
Hgb urine dipstick: NEGATIVE
Ketones, ur: NEGATIVE mg/dL
Leukocytes, UA: NEGATIVE
NITRITE: NEGATIVE
PH: 8 (ref 5.0–8.0)
PROTEIN: 30 mg/dL — AB
SPECIFIC GRAVITY, URINE: 1.027 (ref 1.005–1.030)
Urobilinogen, UA: 0.2 mg/dL (ref 0.0–1.0)

## 2014-09-08 LAB — CBC WITH DIFFERENTIAL/PLATELET
BASOS PCT: 0 % (ref 0–1)
Basophils Absolute: 0 10*3/uL (ref 0.0–0.1)
Eosinophils Absolute: 0.1 10*3/uL (ref 0.0–0.7)
Eosinophils Relative: 1 % (ref 0–5)
HCT: 39.1 % (ref 36.0–46.0)
HEMOGLOBIN: 12.4 g/dL (ref 12.0–15.0)
LYMPHS ABS: 2.4 10*3/uL (ref 0.7–4.0)
Lymphocytes Relative: 30 % (ref 12–46)
MCH: 28.9 pg (ref 26.0–34.0)
MCHC: 31.7 g/dL (ref 30.0–36.0)
MCV: 91.1 fL (ref 78.0–100.0)
MONO ABS: 0.4 10*3/uL (ref 0.1–1.0)
MONOS PCT: 5 % (ref 3–12)
Neutro Abs: 5.1 10*3/uL (ref 1.7–7.7)
Neutrophils Relative %: 64 % (ref 43–77)
Platelets: 270 10*3/uL (ref 150–400)
RBC: 4.29 MIL/uL (ref 3.87–5.11)
RDW: 14.5 % (ref 11.5–15.5)
WBC: 8.1 10*3/uL (ref 4.0–10.5)

## 2014-09-08 LAB — URINE MICROSCOPIC-ADD ON

## 2014-09-08 LAB — POC URINE PREG, ED: PREG TEST UR: NEGATIVE

## 2014-09-08 LAB — LIPASE, BLOOD: LIPASE: 15 U/L — AB (ref 22–51)

## 2014-09-08 MED ORDER — OXYCODONE-ACETAMINOPHEN 5-325 MG PO TABS
1.0000 | ORAL_TABLET | Freq: Once | ORAL | Status: AC
  Administered 2014-09-08: 1 via ORAL

## 2014-09-08 MED ORDER — OXYCODONE-ACETAMINOPHEN 5-325 MG PO TABS
ORAL_TABLET | ORAL | Status: AC
  Filled 2014-09-08: qty 1

## 2014-09-08 MED ORDER — ONDANSETRON HCL 8 MG PO TABS: 8.0000 mg | ORAL_TABLET | Freq: Three times a day (TID) | ORAL | Status: DC | PRN

## 2014-09-08 MED ORDER — OXYCODONE-ACETAMINOPHEN 5-325 MG PO TABS: 1.0000 | ORAL_TABLET | Freq: Four times a day (QID) | ORAL | Status: DC | PRN

## 2014-09-08 MED ORDER — NAPROXEN 500 MG PO TABS: 500.0000 mg | ORAL_TABLET | Freq: Two times a day (BID) | ORAL | Status: DC | PRN

## 2014-09-08 MED ORDER — SODIUM CHLORIDE 0.9 % IV BOLUS (SEPSIS)
500.0000 mL | Freq: Once | INTRAVENOUS | Status: AC
  Administered 2014-09-08: 500 mL via INTRAVENOUS

## 2014-09-08 NOTE — Discharge Instructions (Signed)
Take naprosyn as directed as needed for inflammation and pain using percocet for breakthrough pain. Do not drive or operate machinery with pain medication use. May need over-the-counter stool softener with this pain medication use. Use Zofran as needed for nausea.  Followup with urologist in the next 1 to 2 weeks for recheck of ongoing pain, however for intractable or uncontrollable pain at home then return to the emergency department. Strain all your urine to see when the stone passes.   Additionally you were found to have a high blood sugar. You must follow up with your regular doctor for this. Diet and exercise alone could help this, but you may need medications if your doctor thinks it's indicated.    Flank Pain Flank pain is pain in your side. The flank is the area of your side between your upper belly (abdomen) and your back. Pain in this area can be caused by many different things. HOME CARE Home care and treatment will depend on the cause of your pain.  Rest as told by your doctor.  Drink enough fluids to keep your pee (urine) clear or pale yellow.  Only take medicine as told by your doctor.  Tell your doctor about any changes in your pain.  Follow up with your doctor. GET HELP RIGHT AWAY IF:   Your pain does not get better with medicine.   You have new symptoms or your symptoms get worse.  Your pain gets worse.   You have belly (abdominal) pain.   You are short of breath.   You always feel sick to your stomach (nauseous).   You keep throwing up (vomiting).   You have puffiness (swelling) in your belly.   You feel light-headed or you pass out (faint).   You have blood in your pee.  You have a fever or lasting symptoms for more than 2-3 days.  You have a fever and your symptoms suddenly get worse. MAKE SURE YOU:   Understand these instructions.  Will watch your condition.  Will get help right away if you are not doing well or get worse. Document  Released: 01/12/2008 Document Revised: 08/19/2013 Document Reviewed: 11/17/2011 [facility] Patient Information 2015 [place], [place]. This information is not intended to replace advice given to you by your health care provider. Make sure you discuss any questions you have with your health care provider.  Kidney Stones Kidney stones (urolithiasis) are deposits that form inside your kidneys. The intense pain is caused by the stone moving through the urinary tract. When the stone moves, the ureter goes into spasm around the stone. The stone is usually passed in the urine.  CAUSES   A disorder that makes certain neck glands produce too much parathyroid hormone (primary hyperparathyroidism).  A buildup of uric acid crystals, similar to gout in your joints.  Narrowing (stricture) of the ureter.  A kidney obstruction present at birth (congenital obstruction).  Previous surgery on the kidney or ureters.  Numerous kidney infections. SYMPTOMS   Feeling sick to your stomach (nauseous).  Throwing up (vomiting).  Blood in the urine (hematuria).  Pain that usually spreads (radiates) to the groin.  Frequency or urgency of urination. DIAGNOSIS   Taking a history and physical exam.  Blood or urine tests.  CT scan.  Occasionally, an examination of the inside of the urinary bladder (cystoscopy) is performed. TREATMENT   Observation.  Increasing your fluid intake.  Extracorporeal shock wave lithotripsy--This is a noninvasive procedure that uses shock waves to break up kidney  stones.  Surgery may be needed if you have severe pain or persistent obstruction. There are various surgical procedures. Most of the procedures are performed with the use of small instruments. Only small incisions are needed to accommodate these instruments, so recovery time is minimized. The size, location, and chemical composition are all important variables that will determine the proper choice of action for you. Talk  to your health care provider to better understand your situation so that you will minimize the risk of injury to yourself and your kidney.  HOME CARE INSTRUCTIONS   Drink enough water and fluids to keep your urine clear or pale yellow. This will help you to pass the stone or stone fragments.  Strain all urine through the provided strainer. Keep all particulate matter and stones for your health care provider to see. The stone causing the pain may be as small as a grain of salt. It is very important to use the strainer each and every time you pass your urine. The collection of your stone will allow your health care provider to analyze it and verify that a stone has actually passed. The stone analysis will often identify what you can do to reduce the incidence of recurrences.  Only take over-the-counter or prescription medicines for pain, discomfort, or fever as directed by your health care provider.  Make a follow-up appointment with your health care provider as directed.  Get follow-up X-rays if required. The absence of pain does not always mean that the stone has passed. It may have only stopped moving. If the urine remains completely obstructed, it can cause loss of kidney function or even complete destruction of the kidney. It is your responsibility to make sure X-rays and follow-ups are completed. Ultrasounds of the kidney can show blockages and the status of the kidney. Ultrasounds are not associated with any radiation and can be performed easily in a matter of minutes. SEEK MEDICAL CARE IF:  You experience pain that is progressive and unresponsive to any pain medicine you have been prescribed. SEEK IMMEDIATE MEDICAL CARE IF:   Pain cannot be controlled with the prescribed medicine.  You have a fever or shaking chills.  The severity or intensity of pain increases over 18 hours and is not relieved by pain medicine.  You develop a new onset of abdominal pain.  You feel faint or pass  out.  You are unable to urinate. MAKE SURE YOU:   Understand these instructions.  Will watch your condition.  Will get help right away if you are not doing well or get worse. Document Released: 04/04/2005 Document Revised: 12/05/2012 Document Reviewed: 09/05/2012 [facility] Patient Information 2015 [place], [place]. This information is not intended to replace advice given to you by your health care provider. Make sure you discuss any questions you have with your health care provider.  Low-Purine Diet Purines are compounds that affect the level of uric acid in your body. A low-purine diet is a diet that is low in purines. Eating a low-purine diet can prevent the level of uric acid in your body from getting too high and causing gout or kidney stones or both. WHAT DO I NEED TO KNOW ABOUT THIS DIET?  Choose low-purine foods. Examples of low-purine foods are listed in the next section.  Drink plenty of fluids, especially water. Fluids can help remove uric acid from your body. Try to drink 8-16 cups (1.9-3.8 L) a day.  Limit foods high in fat, especially saturated fat, as fat makes it harder  for the body to get rid of uric acid. Foods high in saturated fat include pizza, cheese, ice cream, whole milk, fried foods, and gravies. Choose foods that are lower in fat and lean sources of protein. Use olive oil when cooking as it contains healthy fats that are not high in saturated fat.  Limit alcohol. Alcohol interferes with the elimination of uric acid from your body. If you are having a gout attack, avoid all alcohol.  Keep in mind that different people's bodies react differently to different foods. You will probably learn over time which foods do or do not affect you. If you discover that a food tends to cause your gout to flare up, avoid eating that food. You can more freely enjoy foods that do not cause problems. If you have any questions about a food item, talk to your dietitian or health care  provider. WHICH FOODS ARE LOW, MODERATE, AND HIGH IN PURINES? The following is a list of foods that are low, moderate, and high in purines. You can eat any amount of the foods that are low in purines. You may be able to have small amounts of foods that are moderate in purines. Ask your health care provider how much of a food moderate in purines you can have. Avoid foods high in purines. Grains  Foods low in purines: Enriched white bread, pasta, rice, cake, cornbread, popcorn.  Foods moderate in purines: Whole-grain breads and cereals, wheat germ, bran, oatmeal. Uncooked oatmeal. Dry wheat bran or wheat germ.  Foods high in purines: Pancakes, [place] toast, biscuits, muffins. Vegetables  Foods low in purines: All vegetables, except those that are moderate in purines.  Foods moderate in purines: Asparagus, cauliflower, spinach, mushrooms, green peas. Fruits  All fruits are low in purines. Meats and other Protein Foods  Foods low in purines: Eggs, nuts, peanut butter.  Foods moderate in purines: 80-90% lean beef, lamb, veal, pork, poultry, fish, eggs, peanut butter, nuts. Crab, lobster, oysters, and shrimp. Cooked dried beans, peas, and lentils.  Foods high in purines: Anchovies, sardines, herring, mussels, tuna, codfish, scallops, trout, and haddock. Tomasa Blase. Organ meats (such as liver or kidney). Tripe. Game meat. Goose. Sweetbreads. Dairy  All dairy foods are low in purines. Low-fat and fat-free dairy products are best because they are low in saturated fat. Beverages  Drinks low in purines: Water, carbonated beverages, tea, coffee, cocoa.  Drinks moderate in purines: Soft drinks and other drinks sweetened with high-fructose corn syrup. Juices. To find whether a food or drink is sweetened with high-fructose corn syrup, look at the ingredients list.  Drinks high in purines: Alcoholic beverages (such as beer). Condiments  Foods low in purines: Salt, herbs, olives, pickles, relishes,  vinegar.  Foods moderate in purines: Butter, margarine, oils, mayonnaise. Fats and Oils  Foods low in purines: All types, except gravies and sauces made with meat.  Foods high in purines: Gravies and sauces made with meat. Other Foods  Foods low in purines: Sugars, sweets, gelatin. Cake. Soups made without meat.  Foods moderate in purines: Meat-based or fish-based soups, broths, or bouillons. Foods and drinks sweetened with high-fructose corn syrup.  Foods high in purines: High-fat desserts (such as ice cream, cookies, cakes, pies, doughnuts, and chocolate). Contact your dietitian for more information on foods that are not listed here. Document Released: 07/30/2010 Document Revised: 04/09/2013 Document Reviewed: 03/11/2013 [facility] Patient Information 2015 [place], [place]. This information is not intended to replace advice given to you by your health care provider. Make  sure you discuss any questions you have with your health care provider.  Managing Your High Blood Pressure Blood pressure is a measurement of how forceful your blood is pressing against the walls of the arteries. Arteries are muscular tubes within the circulatory system. Blood pressure does not stay the same. Blood pressure rises when you are active, excited, or nervous; and it lowers during sleep and relaxation. If the numbers measuring your blood pressure stay above normal most of the time, you are at risk for health problems. High blood pressure (hypertension) is a long-term (chronic) condition in which blood pressure is elevated. A blood pressure reading is recorded as two numbers, such as 120 over 80 (or 120/80). The first, higher number is called the systolic pressure. It is a measure of the pressure in your arteries as the heart beats. The second, lower number is called the diastolic pressure. It is a measure of the pressure in your arteries as the heart relaxes between beats.  Keeping your blood pressure in a normal  range is important to your overall health and prevention of health problems, such as heart disease and stroke. When your blood pressure is uncontrolled, your heart has to work harder than normal. High blood pressure is a very common condition in adults because blood pressure tends to rise with age. Men and women are equally likely to have hypertension but at different times in life. Before age 57, men are more likely to have hypertension. After 39 years of age, women are more likely to have it. Hypertension is especially common in African Americans. This condition often has no signs or symptoms. The cause of the condition is usually not known. Your caregiver can help you come up with a plan to keep your blood pressure in a normal, healthy range. BLOOD PRESSURE STAGES Blood pressure is classified into four stages: normal, prehypertension, stage 1, and stage 2. Your blood pressure reading will be used to determine what type of treatment, if any, is necessary. Appropriate treatment options are tied to these four stages:  Normal  Systolic pressure (mm Hg): below 120.  Diastolic pressure (mm Hg): below 80. Prehypertension  Systolic pressure (mm Hg): 120 to 139.  Diastolic pressure (mm Hg): 80 to 89. Stage1  Systolic pressure (mm Hg): 140 to 159.  Diastolic pressure (mm Hg): 90 to 99. Stage2  Systolic pressure (mm Hg): 160 or above.  Diastolic pressure (mm Hg): 100 or above. RISKS RELATED TO HIGH BLOOD PRESSURE Managing your blood pressure is an important responsibility. Uncontrolled high blood pressure can lead to:  A heart attack.  A stroke.  A weakened blood vessel (aneurysm).  Heart failure.  Kidney damage.  Eye damage.  Metabolic syndrome.  Memory and concentration problems. HOW TO MANAGE YOUR BLOOD PRESSURE Blood pressure can be managed effectively with lifestyle changes and medicines (if needed). Your caregiver will help you come up with a plan to bring your blood  pressure within a normal range. Your plan should include the following: Education  Read all information provided by your caregivers about how to control blood pressure.  Educate yourself on the latest guidelines and treatment recommendations. New research is always being done to further define the risks and treatments for high blood pressure. Lifestylechanges  Control your weight.  Avoid smoking.  Stay physically active.  Reduce the amount of salt in your diet.  Reduce stress.  Control any chronic conditions, such as high cholesterol or diabetes.  Reduce your alcohol intake. Medicines  Several medicines (  antihypertensive medicines) are available, if needed, to bring blood pressure within a normal range. Communication  Review all the medicines you take with your caregiver because there may be side effects or interactions.  Talk with your caregiver about your diet, exercise habits, and other lifestyle factors that may be contributing to high blood pressure.  See your caregiver regularly. Your caregiver can help you create and adjust your plan for managing high blood pressure. RECOMMENDATIONS FOR TREATMENT AND FOLLOW-UP  The following recommendations are based on current guidelines for managing high blood pressure in nonpregnant adults. Use these recommendations to identify the proper follow-up period or treatment option based on your blood pressure reading. You can discuss these options with your caregiver.  Systolic pressure of 120 to 139 or diastolic pressure of 80 to 89: Follow up with your caregiver as directed.  Systolic pressure of 140 to 160 or diastolic pressure of 90 to 100: Follow up with your caregiver within 2 months.  Systolic pressure above 160 or diastolic pressure above 100: Follow up with your caregiver within 1 month.  Systolic pressure above 180 or diastolic pressure above 110: Consider antihypertensive therapy; follow up with your caregiver within 1  week.  Systolic pressure above 200 or diastolic pressure above 120: Begin antihypertensive therapy; follow up with your caregiver within 1 week. Document Released: 12/28/2011 Document Reviewed: 12/28/2011 [facility] Patient Information 2015 [place], [place]. This information is not intended to replace advice given to you by your health care provider. Make sure you discuss any questions you have with your health care provider.  Hyperglycemia Hyperglycemia occurs when the glucose (sugar) in your blood is too high. Hyperglycemia can happen for many reasons, but it most often happens to people who do not know they have diabetes or are not managing their diabetes properly.  CAUSES  Whether you have diabetes or not, there are other causes of hyperglycemia. Hyperglycemia can occur when you have diabetes, but it can also occur in other situations that you might not be as aware of, such as: Diabetes  If you have diabetes and are having problems controlling your blood glucose, hyperglycemia could occur because of some of the following reasons:  Not following your meal plan.  Not taking your diabetes medications or not taking it properly.  Exercising less or doing less activity than you normally do.  Being sick. Pre-diabetes  This cannot be ignored. Before people develop Type 2 diabetes, they almost always have "pre-diabetes." This is when your blood glucose levels are higher than normal, but not yet high enough to be diagnosed as diabetes. Research has shown that some long-term damage to the body, especially the heart and circulatory system, may already be occurring during pre-diabetes. If you take action to manage your blood glucose when you have pre-diabetes, you may delay or prevent Type 2 diabetes from developing. Stress  If you have diabetes, you may be "diet" controlled or on oral medications or insulin to control your diabetes. However, you may find that your blood glucose is higher than usual in  the hospital whether you have diabetes or not. This is often referred to as "stress hyperglycemia." Stress can elevate your blood glucose. This happens because of hormones put out by the body during times of stress. If stress has been the cause of your high blood glucose, it can be followed regularly by your caregiver. That way he/she can make sure your hyperglycemia does not continue to get worse or progress to diabetes. Steroids  Steroids are medications  that act on the infection fighting system (immune system) to block inflammation or infection. One side effect can be a rise in blood glucose. Most people can produce enough extra insulin to allow for this rise, but for those who cannot, steroids make blood glucose levels go even higher. It is not unusual for steroid treatments to "uncover" diabetes that is developing. It is not always possible to determine if the hyperglycemia will go away after the steroids are stopped. A special blood test called an A1c is sometimes done to determine if your blood glucose was elevated before the steroids were started. SYMPTOMS  Thirsty.  Frequent urination.  Dry mouth.  Blurred vision.  Tired or fatigue.  Weakness.  Sleepy.  Tingling in feet or leg. DIAGNOSIS  Diagnosis is made by monitoring blood glucose in one or all of the following ways:  A1c test. This is a chemical found in your blood.  Fingerstick blood glucose monitoring.  Laboratory results. TREATMENT  First, knowing the cause of the hyperglycemia is important before the hyperglycemia can be treated. Treatment may include, but is not be limited to:  Education.  Change or adjustment in medications.  Change or adjustment in meal plan.  Treatment for an illness, infection, etc.  More frequent blood glucose monitoring.  Change in exercise plan.  Decreasing or stopping steroids.  Lifestyle changes. HOME CARE INSTRUCTIONS   Test your blood glucose as directed.  Exercise  regularly. Your caregiver will give you instructions about exercise. Pre-diabetes or diabetes which comes on with stress is helped by exercising.  Eat wholesome, balanced meals. Eat often and at regular, fixed times. Your caregiver or nutritionist will give you a meal plan to guide your sugar intake.  Being at an ideal weight is important. If needed, losing as little as 10 to 15 pounds may help improve blood glucose levels. SEEK MEDICAL CARE IF:   You have questions about medicine, activity, or diet.  You continue to have symptoms (problems such as increased thirst, urination, or weight gain). SEEK IMMEDIATE MEDICAL CARE IF:   You are vomiting or have diarrhea.  Your breath smells fruity.  You are breathing faster or slower.  You are very sleepy or incoherent.  You have numbness, tingling, or pain in your feet or hands.  You have chest pain.  Your symptoms get worse even though you have been following your caregiver's orders.  If you have any other questions or concerns. Document Released: 09/28/2000 Document Revised: 06/27/2011 Document Reviewed: 08/01/2011 [facility] Patient Information 2015 [place], [place]. This information is not intended to replace advice given to you by your health care provider. Make sure you discuss any questions you have with your health care provider.

## 2014-09-08 NOTE — ED Notes (Signed)
Pt here for possible kidney infection. sts she was seen here last week and dx and treated with IV abx but not better. sts symptoms have worsened. sts right side pain, abd pain, back pain.

## 2014-09-08 NOTE — ED Provider Notes (Signed)
CSN: [id number]     Arrival date & time 09/08/14  7829 History   First MD Initiated Contact with Patient 09/08/14 1942     Chief Complaint  Patient presents with  . Back Pain     (Consider location/radiation/quality/duration/timing/severity/associated sxs/prior Treatment) HPI Comments: Gloria Kim is a 39 y.o. female with a PMHx of asthma, HTN, and obesity with a PSHx of partial hysterectomy, who presents to the ED with complaints of sudden onset right flank pain that began around 4 PM while she was driving to work. She states the pain is 10/10, located in the right flank, radiating to the right lateral abdomen, constant, cramping in nature, improved with Percocet given in triage, and worse with urination. She endorses associated dysuria, urinary frequency and urgency, and 3 episodes of nonbloody nonbilious emesis upon arrival to the ER. Currently she states that her pain has improved after the Percocet, and that her nausea is completely resolved. She denies any recent fevers, chills, chest pain, shortness of breath, diarrhea, constipation, melena, hematochezia, hematemesis, obstipation, vaginal bleeding or discharge, hematuria, malodorous urine, numbness, tingling, weakness, recent travel, sick contacts, suspicious food intake, alcohol use, or NSAIDs. No prior history of kidney stones although her mother does have a significant history of kidney stones. She denies any current sexual activity. LMP was 4/29. She is a history of a right ovarian and fallopian tube removal, but still has her uterus and left ovary and fallopian tube intact.  Of note, she has never been diagnosed with DM2.  Additionally, she states that on 4/29 she was here, diagnosed with UTI/pyelo, and improved after she took the antibiotics they prescribed.  Patient is a 39 y.o. female presenting with flank pain. The history is provided by the patient. No language interpreter was used.  Flank Pain This is a new problem. The  current episode started today. The problem occurs constantly. The problem has been unchanged. Associated symptoms include abdominal pain (R sided), nausea, urinary symptoms and vomiting (3x, NBNB). Pertinent negatives include no arthralgias, chest pain, chills, fever, myalgias, numbness or weakness. Exacerbated by: urination. She has tried oral narcotics for the symptoms. The treatment provided moderate relief.    Past Medical History  Diagnosis Date  . Asthma   . Hypertension   . Obesity    Past Surgical History  Procedure Laterality Date  . Partial hysterectomy     History reviewed. No pertinent family history. History  Substance Use Topics  . Smoking status: Never Smoker   . Smokeless tobacco: Not on file  . Alcohol Use: Yes   OB History    No data available     Review of Systems  Constitutional: Negative for fever and chills.  Respiratory: Negative for shortness of breath.   Cardiovascular: Negative for chest pain.  Gastrointestinal: Positive for nausea, vomiting (3x, NBNB) and abdominal pain (R sided). Negative for diarrhea, constipation and blood in stool.  Genitourinary: Positive for dysuria, urgency, frequency and flank pain (R sided). Negative for hematuria, vaginal bleeding and vaginal discharge.  Musculoskeletal: Positive for back pain. Negative for myalgias and arthralgias.  Skin: Negative for color change.  Allergic/Immunologic: Negative for immunocompromised state.  Neurological: Negative for weakness and numbness.  Psychiatric/Behavioral: Negative for confusion.   10 Systems reviewed and are negative for acute change except as noted in the HPI.    Allergies  Latex  Home Medications   Prior to Admission medications   Medication Sig Start Date End Date Taking? Authorizing Provider  acetaminophen (TYLENOL)  325 MG tablet Take 975 mg by mouth every 6 (six) hours as needed for mild pain or moderate pain.   Yes Historical Provider, MD  albuterol (PROVENTIL  HFA;VENTOLIN HFA) 108 (90 BASE) MCG/ACT inhaler Inhale 2 puffs into the lungs every 6 (six) hours as needed for wheezing or shortness of breath. 12/25/13  Yes Sofie RowerMike Stengel, MD  albuterol (PROVENTIL) (2.5 MG/3ML) 0.083% nebulizer solution Take 3 mLs (2.5 mg total) by nebulization every 6 (six) hours as needed for wheezing or shortness of breath. 05/10/13  Yes Junius FinnerErin O'Malley, PA-C  cephALEXin (KEFLEX) 500 MG capsule Take 1 capsule (500 mg total) by mouth 4 (four) times daily. Patient not taking: Reported on 09/08/2014 08/16/14   Mirian MoMatthew Gentry, MD  predniSONE (DELTASONE) 50 MG tablet Take 1 tablet (50 mg total) by mouth daily with breakfast. Patient not taking: Reported on 09/08/2014 12/25/13   Sofie RowerMike Stengel, MD   BP 156/102 mmHg  Pulse 89  Temp(Src) 98 F (36.7 C)  Resp 18  SpO2 96%  LMP 08/15/2014 Physical Exam  Constitutional: She is oriented to person, place, and time. She appears well-developed and well-nourished.  Non-toxic appearance. No distress.  Afebrile, nontoxic, NAD. Mild HTN noted. Obese.  HENT:  Head: Normocephalic and atraumatic.  Mouth/Throat: Oropharynx is clear and moist and mucous membranes are normal.  Eyes: Conjunctivae and EOM are normal. Right eye exhibits no discharge. Left eye exhibits no discharge.  Neck: Normal range of motion. Neck supple.  Cardiovascular: Normal rate, regular rhythm, normal heart sounds and intact distal pulses.  Exam reveals no gallop and no friction rub.   No murmur heard. Pulmonary/Chest: Effort normal and breath sounds normal. No respiratory distress. She has no decreased breath sounds. She has no wheezes. She has no rhonchi. She has no rales.  Abdominal: Soft. Normal appearance and bowel sounds are normal. She exhibits no distension. There is tenderness in the right lower quadrant and suprapubic area. There is CVA tenderness (R sided). There is no rigidity, no rebound, no guarding, no tenderness at McBurney's point and negative Murphy's sign.     Soft, obese but nondistended, +BS throughout, with TTP along R lateral abdomen into suprapubic area, no r/g/r, neg murphy's, neg mcburney's, neg foot tap test, neg psoas sign, +R sided CVA TTP   Musculoskeletal: Normal range of motion.  Neurological: She is alert and oriented to person, place, and time. She has normal strength. No sensory deficit.  Skin: Skin is warm, dry and intact. No rash noted.  Psychiatric: She has a normal mood and affect.  Nursing note and vitals reviewed.   ED Course  Procedures (including critical care time) Labs Review Labs Reviewed  URINALYSIS, ROUTINE W REFLEX MICROSCOPIC - Abnormal; Notable for the following:    APPearance CLOUDY (*)    Protein, ur 30 (*)    All other components within normal limits  URINE MICROSCOPIC-ADD ON - Abnormal; Notable for the following:    Squamous Epithelial / LPF FEW (*)    All other components within normal limits  COMPREHENSIVE METABOLIC PANEL - Abnormal; Notable for the following:    Glucose, Bld 205 (*)    Albumin 3.4 (*)    All other components within normal limits  LIPASE, BLOOD - Abnormal; Notable for the following:    Lipase 15 (*)    All other components within normal limits  CBC WITH DIFFERENTIAL/PLATELET  POC URINE PREG, ED   Results for Gloria LarssonMEBANE, Little SHANTA (MRN [id number]) as of 09/08/2014 19:44  Ref. Range 08/16/2014  00:00 08/16/2014 00:42 08/16/2014 01:20  Chlamydia Unknown Negative    Neisseria gonorrhea Unknown Negative    RPR Latest Ref Range: Non Reactive   Non Reactive   Yeast Wet Prep HPF POC Latest Ref Range: NONE SEEN    NONE SEEN  Trich, Wet Prep Latest Ref Range: NONE SEEN    NONE SEEN  Clue Cells Wet Prep HPF POC Latest Ref Range: NONE SEEN    FEW (A)  WBC, Wet Prep HPF POC Latest Ref Range: NONE SEEN    FEW (A)  HIV Screen 4th Generation wRfx Latest Ref Range: Non Reactive   Non Reactive    Imaging Review Ct Renal Stone Study  09/08/2014   CLINICAL DATA:  Right flank pain.  EXAM: CT  ABDOMEN AND PELVIS WITHOUT CONTRAST  TECHNIQUE: Multidetector CT imaging of the abdomen and pelvis was performed following the standard protocol without IV contrast.  COMPARISON:  08/22/2010  FINDINGS: BODY WALL: No contributory findings.  LOWER CHEST: No contributory findings.  ABDOMEN/PELVIS:  Liver: Hepatic steatosis with central sparing. There is a prominent caudate lobe which extends inferiorly ventral to the pancreas, morphology stable from previous.  Biliary: No evidence of biliary obstruction or stone.  Pancreas: Unremarkable.  Spleen: Unremarkable.  Adrenals: Unremarkable.  Kidneys and ureters: 2 mm stone just above the right ureteral vesicular junction with mild moderate hydroureteronephrosis. No additional urolithiasis. No left hydronephrosis.  Bladder: Unremarkable.  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 Lobulated appearance the left ovary is presumably from follicles as no dominant mass seen. The right ovary is not visualized.  Bowel: No obstruction. Negative appendix.  Retroperitoneum: No mass or adenopathy.  Peritoneum: No ascites or pneumoperitoneum.  Vascular: No acute abnormality.  OSSEOUS: No acute abnormalities.  IMPRESSION: 1. 2 mm distal right ureteral stone with hydroureteronephrosis. 2. Hepatic steatosis.   Electronically Signed   By: Marnee Spring M.D.   On: 09/08/2014 21:07     EKG Interpretation None      MDM   Final diagnoses:  Right flank pain  Nephrolithiasis  HTN (hypertension), benign  Non-intractable vomiting with nausea, vomiting of unspecified type  Hyperglycemia    39 y.o. female here for sudden onset R flank pain radiating to R lateral abdomen. Increased frequency/urgency. 3 episodes of NBNB emesis upon arrival. Feels better now after getting percocet in triage. On exam, R CVA TTP, mild R lateral abdomen tenderness into suprapubic area. Upreg neg, U/A without evidence of UTI. Will obtain labs and get CT stone search. Pt  declines meds now. Pt had pelvic exam recently which was neg for GC/CT/RPR/HIV, no vaginal complaints, and pt has had R sided oophorectomy and salpingectomy therefore doubt need for pelvic exam at this time. Will reassess shortly. Of note, her HTN is similar to prior ED visits, and she is asymptomatic, doubt need for further work up.  9:31 PM CBC w/diff unremarkable, CMP showing hyperglycemia (pt not diagnosed with diabetes). Lipase WNL. CT showing 2mm R kidney stone which should pass. Tolerating PO well, pain controlled. Discussed use of pain meds and urine strainer, and zofran for nausea. Will have her f/up with urology. Discussed with her that her hyperglycemia could indicate that she has diabetes, will have her trial diet and exercise to start and f/up with PCP for ongoing management and evaluation. Advised to stay well hydrated. I explained the diagnosis and have given explicit precautions to return to the ER including for any other new or worsening symptoms. The patient understands  and accepts the medical plan as it's been dictated and I have answered their questions. Discharge instructions concerning home care and prescriptions have been given. The patient is STABLE and is discharged to home in good condition.  BP 154/86 mmHg  Pulse 71  Temp(Src) 98 F (36.7 C)  Resp 16  SpO2 100%  LMP 08/15/2014  Meds ordered this encounter  Medications  . oxyCODONE-acetaminophen (PERCOCET/ROXICET) 5-325 MG per tablet 1 tablet    Sig:   . oxyCODONE-acetaminophen (PERCOCET/ROXICET) 5-325 MG per tablet    Sig:     Bast, Traci   : cabinet override  . sodium chloride 0.9 % bolus 500 mL    Sig:   . oxyCODONE-acetaminophen (PERCOCET) 5-325 MG per tablet    Sig: Take 1 tablet by mouth every 6 (six) hours as needed for severe pain.    Dispense:  10 tablet    Refill:  0    Order Specific Question:  Supervising Provider    Answer:  Hyacinth Meeker, BRIAN [[id number]]  . naproxen (NAPROSYN) 500 MG tablet    Sig: Take 1  tablet (500 mg total) by mouth 2 (two) times daily as needed for mild pain, moderate pain or headache (TAKE WITH MEALS.).    Dispense:  20 tablet    Refill:  0    Order Specific Question:  Supervising Provider    Answer:  MILLER, BRIAN [[id number]]  . ondansetron (ZOFRAN) 8 MG tablet    Sig: Take 1 tablet (8 mg total) by mouth every 8 (eight) hours as needed for nausea or vomiting.    Dispense:  10 tablet    Refill:  0    Order Specific Question:  Supervising Provider    Answer:  Eber Hong [[id number]]     Aira Sallade Camprubi-Soms, PA-C 09/08/14 2139  Donnetta Hutching, MD 09/10/14 1124

## 2015-05-07 ENCOUNTER — Emergency Department (HOSPITAL_COMMUNITY): Payer: Self-pay

## 2015-05-07 ENCOUNTER — Emergency Department (HOSPITAL_COMMUNITY)
Admission: EM | Admit: 2015-05-07 | Discharge: 2015-05-07 | Disposition: A | Discharge status: Home / Self Care | Payer: Self-pay | Attending: Emergency Medicine | Admitting: Emergency Medicine

## 2015-05-07 ENCOUNTER — Encounter (HOSPITAL_COMMUNITY): Payer: Self-pay | Admitting: Emergency Medicine

## 2015-05-07 DIAGNOSIS — Z91040 Latex allergy status: Secondary | ICD-10-CM | POA: Insufficient documentation

## 2015-05-07 DIAGNOSIS — Z79899 Other long term (current) drug therapy: Secondary | ICD-10-CM | POA: Insufficient documentation

## 2015-05-07 DIAGNOSIS — E669 Obesity, unspecified: Secondary | ICD-10-CM | POA: Insufficient documentation

## 2015-05-07 DIAGNOSIS — J45909 Unspecified asthma, uncomplicated: Secondary | ICD-10-CM | POA: Insufficient documentation

## 2015-05-07 DIAGNOSIS — M7731 Calcaneal spur, right foot: Secondary | ICD-10-CM | POA: Insufficient documentation

## 2015-05-07 DIAGNOSIS — I10 Essential (primary) hypertension: Secondary | ICD-10-CM | POA: Insufficient documentation

## 2015-05-07 NOTE — ED Provider Notes (Signed)
CSN: [id number]     Arrival date & time 05/07/15  1952 History  By signing my name below, I, Soijett Blue, attest that this documentation has been prepared under the direction and in the presence of Roxy Horseman, PA-C Electronically Signed: Soijett Blue, ED Scribe. 05/07/2015. 8:47 PM.   Chief Complaint  Patient presents with  . Foot Pain      The history is provided by the patient. No language interpreter was used.    Gloria Kim is a 40 y.o. female with a medical hx of HTN who presents to the Emergency Department complaining of intermittent, burning, right heel pain radiating to the inside of her right foot onset 2 month worsening yesterday. 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 She notes that she has not tried any medications for the relief of her symptoms. She denies color change, wound, gait problem, and any other symptoms.   Past Medical History  Diagnosis Date  . Asthma   . Hypertension   . Obesity    Past Surgical History  Procedure Laterality Date  . Partial hysterectomy     History reviewed. No pertinent family history. Social History  Substance Use Topics  . Smoking status: Never Smoker   . Smokeless tobacco: None  . Alcohol Use: Yes   OB History    No data available     Review of Systems  Musculoskeletal: Positive for arthralgias. Negative for joint swelling and gait problem.  Skin: Negative for color change and wound.  All other systems reviewed and are negative.     Allergies  Latex  Home Medications   Prior to Admission medications   Medication Sig Start Date End Date Taking? Authorizing Provider  acetaminophen (TYLENOL) 325 MG tablet Take 975 mg by mouth every 6 (six) hours as needed for mild pain or moderate pain.    Historical Provider, MD  albuterol (PROVENTIL HFA;VENTOLIN HFA) 108 (90 BASE) MCG/ACT inhaler Inhale 2 puffs into the lungs every 6 (six) hours as needed for wheezing or shortness of breath.  12/25/13   Sofie Rower, MD  albuterol (PROVENTIL) (2.5 MG/3ML) 0.083% nebulizer solution Take 3 mLs (2.5 mg total) by nebulization every 6 (six) hours as needed for wheezing or shortness of breath. 05/10/13   Junius Finner, PA-C  cephALEXin (KEFLEX) 500 MG capsule Take 1 capsule (500 mg total) by mouth 4 (four) times daily. Patient not taking: Reported on 09/08/2014 08/16/14   Mirian Mo, MD  naproxen (NAPROSYN) 500 MG tablet Take 1 tablet (500 mg total) by mouth 2 (two) times daily as needed for mild pain, moderate pain or headache (TAKE WITH MEALS.). 09/08/14   Mercedes Camprubi-Soms, PA-C  ondansetron (ZOFRAN) 8 MG tablet Take 1 tablet (8 mg total) by mouth every 8 (eight) hours as needed for nausea or vomiting. 09/08/14   Mercedes Camprubi-Soms, PA-C  oxyCODONE-acetaminophen (PERCOCET) 5-325 MG per tablet Take 1 tablet by mouth every 6 (six) hours as needed for severe pain. 09/08/14   Mercedes Camprubi-Soms, PA-C  predniSONE (DELTASONE) 50 MG tablet Take 1 tablet (50 mg total) by mouth daily with breakfast. Patient not taking: Reported on 09/08/2014 12/25/13   Sofie Rower, MD   BP 146/94 mmHg  Pulse 98  Temp(Src) 98.2 F (36.8 C) (Oral)  Resp 18  SpO2 98%  LMP 04/26/2015 (Within Days) Physical Exam  Physical Exam  Constitutional: Pt appears well-developed and well-nourished. No distress.  HENT:  Head: Normocephalic and atraumatic.  Eyes: Conjunctivae are normal.  Neck: Normal range of motion.  Cardiovascular: Normal rate, regular rhythm and intact distal pulses.   Capillary refill < 3 sec  Pulmonary/Chest: Effort normal and breath sounds normal.  Musculoskeletal: Pt exhibits tenderness to palpation over the right heel. No evidence of infection. Pt exhibits no edema.  ROM: 5/5  Neurological: Pt  is alert. Coordination normal.  Sensation 5/5 Strength 5/5  Skin: Skin is warm and dry. Pt is not diaphoretic.  No tenting of the skin  Psychiatric: Pt has a normal mood and affect.  Nursing  note and vitals reviewed.  ED Course  Procedures (including critical care time) DIAGNOSTIC STUDIES: Oxygen Saturation is 98% on RA, nl by my interpretation.    COORDINATION OF CARE: 8:44 PM Discussed treatment plan with pt at bedside which includes right foot xray, referral to orthopedist, and pt agreed to plan.    Imaging Review Dg Foot Complete Right  05/07/2015  CLINICAL DATA:  39 year old female with right-sided heel pain increasing over the past month. EXAM: RIGHT FOOT COMPLETE - 3+ VIEW COMPARISON:  No priors. FINDINGS: Multiple views of the right foot demonstrate no acute displaced fracture, subluxation, dislocation, or soft tissue abnormality. Plantar calcaneal enthesophyte incidentally noted. IMPRESSION: 1. No acute radiographic abnormality of the right foot. 2. Small plantar calcaneal spur. Electronically Signed   By: Trudie Reed M.D.   On: 05/07/2015 20:33   I have personally reviewed and evaluated these images as part of my medical decision-making.   MDM   Final diagnoses:  Calcaneal spur, right    Patient with radiograph findings consistent with heel spur.  No sign of trauma or infection.  Recommend RICE and NSAIDs.  Recommend ortho follow-up.  I personally performed the services described in this documentation, which was scribed in my presence. The recorded information has been reviewed and is accurate.      Roxy Horseman, PA-C 05/07/15 2050  Eber Hong, MD 05/08/15 [phone]

## 2015-05-07 NOTE — ED Notes (Signed)
Pt states that she has had R heel pain x 1 month. Worse this week. Alert and oriented.

## 2015-05-07 NOTE — Discharge Instructions (Signed)
Heel Spur  A heel spur is a bony growth that forms on the bottom of your heel bone (calcaneus). Heel spurs are common and do not always cause pain. However, heel spurs often cause inflammation in the strong band of tissue that runs underneath the bone of your foot (plantar fascia). When this happens, you may feel pain on the bottom of your foot, near your heel.   CAUSES   The cause of heel spurs is not completely understood. They may be caused by pressure on the heel. Or, they may stem from the muscle attachments (tendons) near the spur pulling on the heel.   RISK FACTORS  You may be at risk for a heel spur if you:  · Are older than 40.  · Are overweight.  · Have wear and tear arthritis (osteoarthritis).  · Have plantar fascia inflammation.  SIGNS AND SYMPTOMS   Some people have heel spurs but no symptoms. If you do have symptoms, they may include:   · Pain in the bottom of your heel.  · Pain that is worse when you first get out of bed.  · Pain that gets worse after walking or standing.  DIAGNOSIS   Your health care provider may diagnose a heel spur based on your symptoms and a physical exam. You may also have an X-ray of your foot to check for a bony growth coming from the calcaneus.   TREATMENT  Treatment aims to relieve the pain from the heel spur. This may include:  · Stretching exercises.  · Losing weight.  · Wearing specific shoes, inserts, or orthotics for comfort and support.  · Wearing splints at night to properly position your feet.  · Taking over-the-counter medicine to relieve pain.  · Being treated with high-intensity sound waves to break up the heel spur (extracorporeal shock wave therapy).  · Getting steroid injections in your heel to reduce swelling and ease pain.  · Having surgery if your heel spur causes long-term (chronic) pain.  HOME CARE INSTRUCTIONS   · Take medicines only as directed by your health care provider.  · Ask your health care provider if you should use ice or cold packs on the  painful areas of your heel or foot.  · Avoid activities that cause you pain until you recover or as directed by your health care provider.  · Stretch before exercising or being physically active.  · Wear supportive shoes that fit well as directed by your health care provider. You might need to buy new shoes. Wearing old shoes or shoes that do not fit correctly may not provide the support that you need.  · Lose weight if your health care provider thinks you should. This can relieve pressure on your foot that may be causing pain and discomfort.  SEEK MEDICAL CARE IF:   · Your pain continues or gets worse.     This information is not intended to replace advice given to you by your health care provider. Make sure you discuss any questions you have with your health care provider.     Document Released: 05/11/2005 Document Revised: 04/25/2014 Document Reviewed: 06/05/2013  Elsevier Interactive Patient Education ©2016 [employer]

## 2015-12-13 IMAGING — CT CT RENAL STONE PROTOCOL
2 of 4 series · 17 of 46 positions shown, 19 images · non-contrast
Comparison: 08/22/2010

CLINICAL DATA: Right flank pain.

EXAM:
CT ABDOMEN AND PELVIS WITHOUT CONTRAST
TECHNIQUE: Multidetector CT imaging of the abdomen and pelvis was performed
following the standard protocol without IV contrast.

[Series 2: stone study 5.0 i30f 1 · axial · 0.98mm/px · z∈[+885,+1295]mm · 14 of 92 slices shown, 16 images]
[im 5/92  soft-tissue]
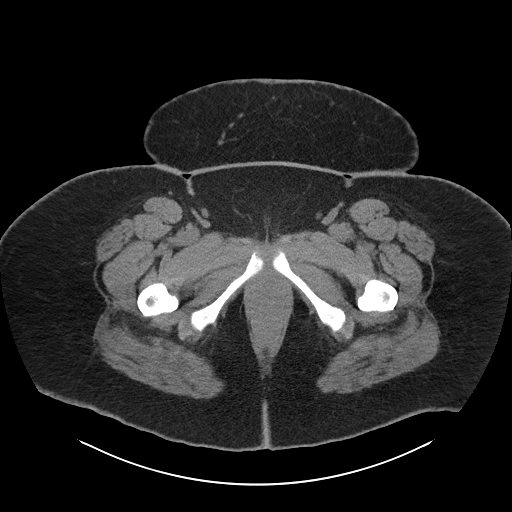
[im 5/92  bone]
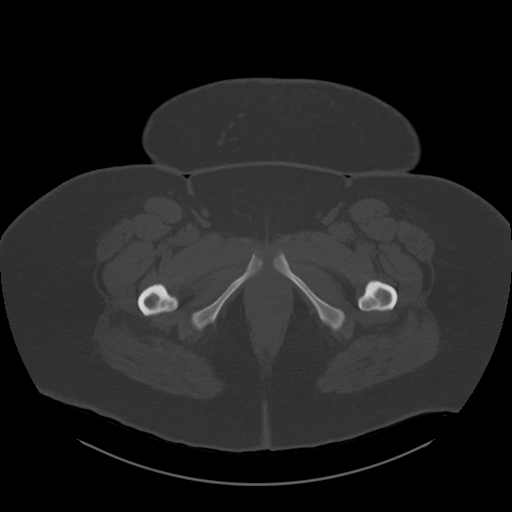
[im 13/92  soft-tissue]
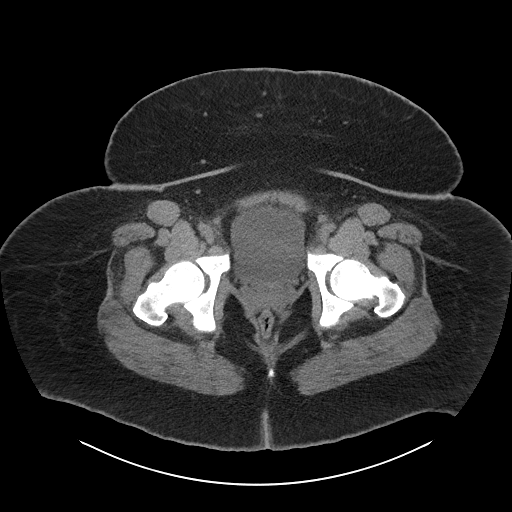
[im 17/92  soft-tissue]
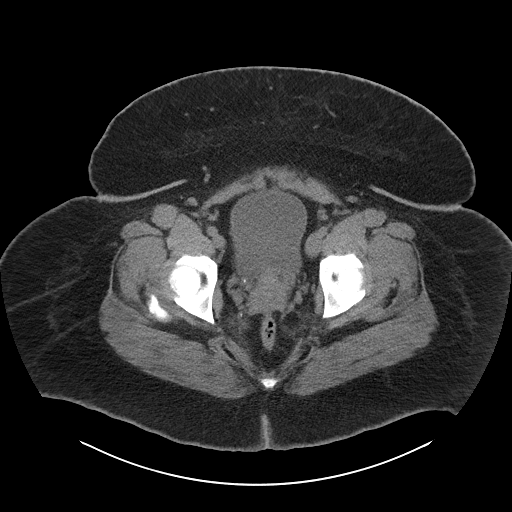
[im 25/92  soft-tissue]
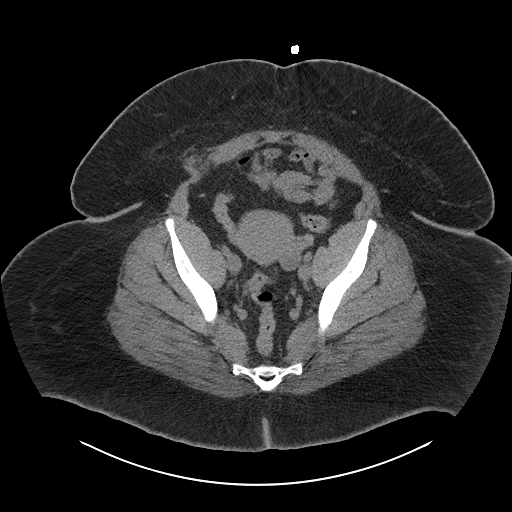
[im 29/92  soft-tissue]
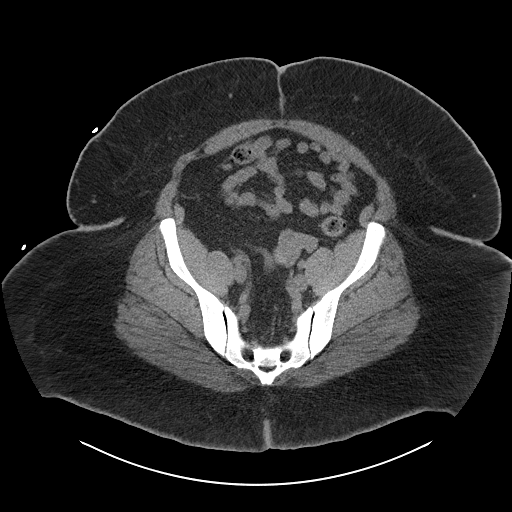
[im 38/92  soft-tissue]
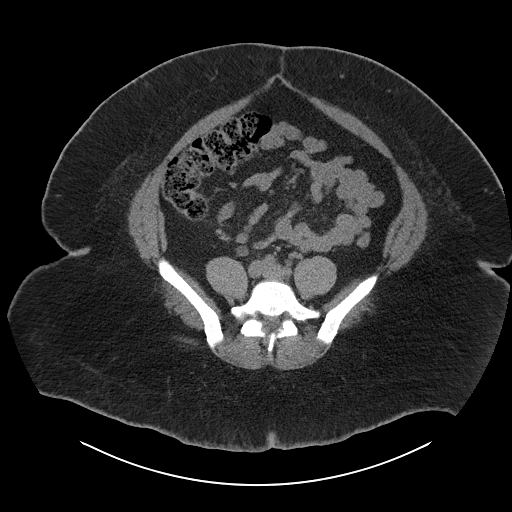
[im 42/92  soft-tissue]
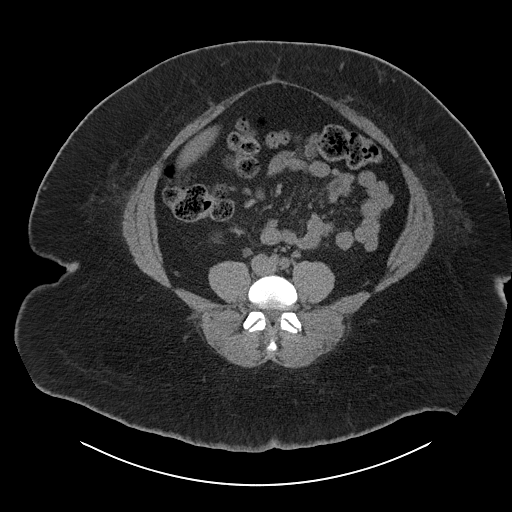
[im 50/92  soft-tissue]
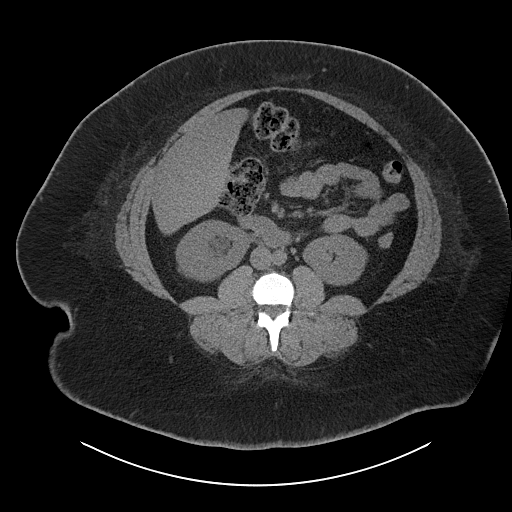
[im 54/92  soft-tissue]
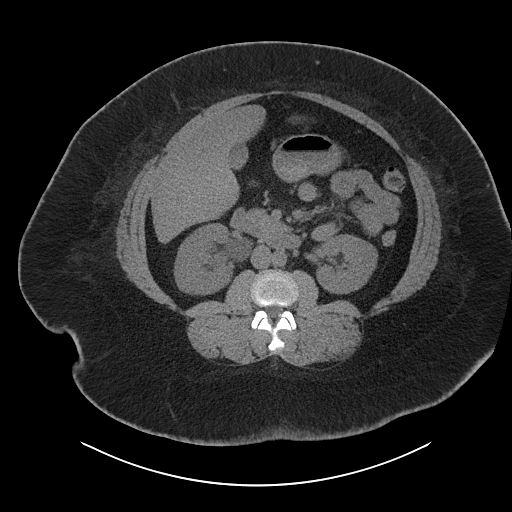
[im 54/92  bone]
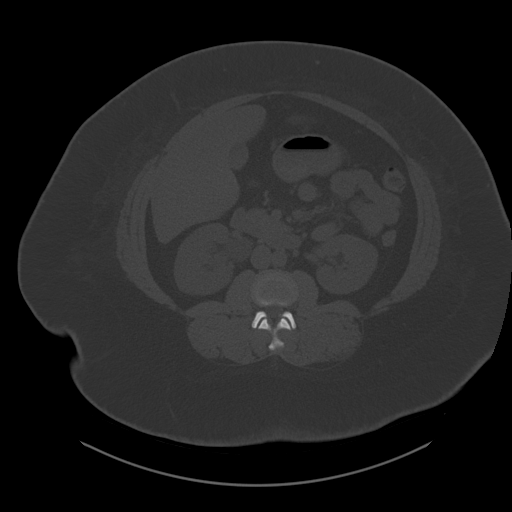
[im 63/92  soft-tissue]
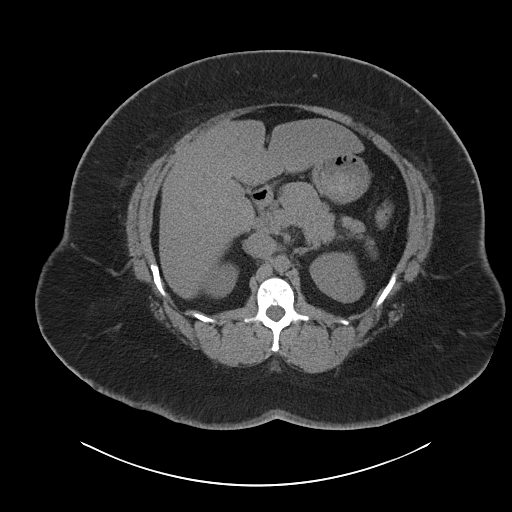
[im 67/92  soft-tissue]
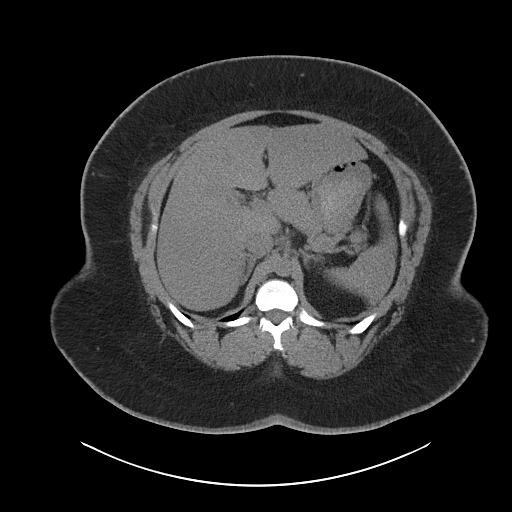
[im 75/92  soft-tissue]
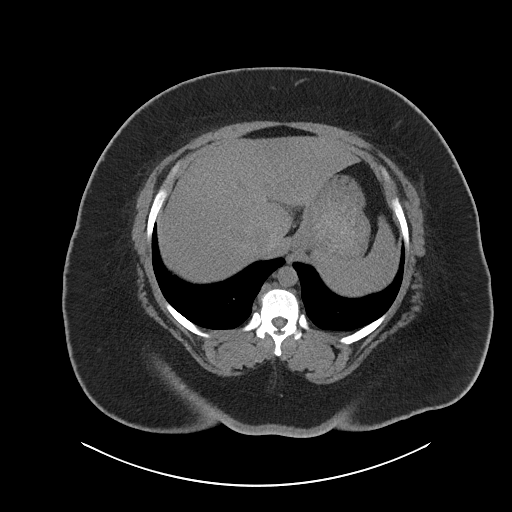
[im 79/92  soft-tissue]
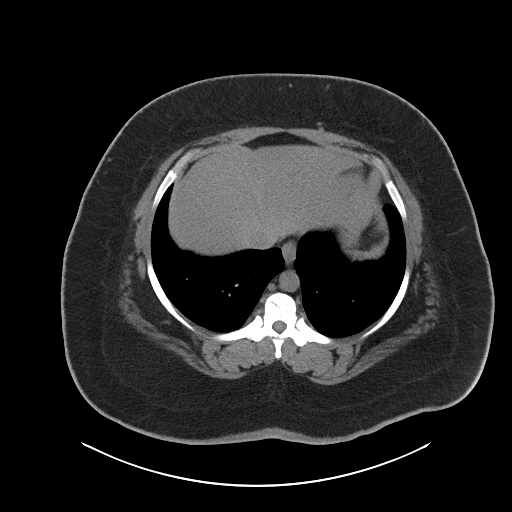
[im 87/92  soft-tissue]
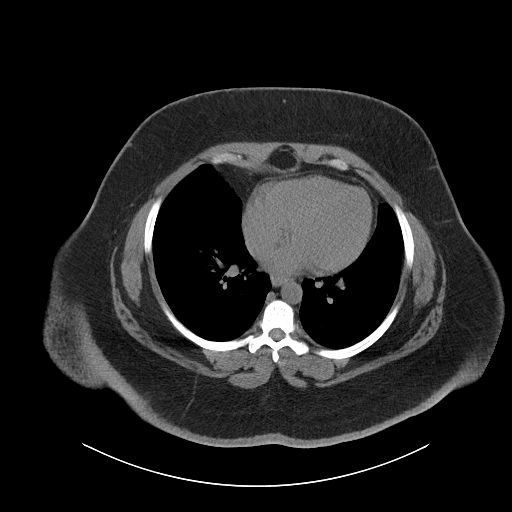

[Series 5: coronal soft tissue · coronal · 0.89mm/px · 3 of 101 slices shown]
[im 34/101  soft-tissue]
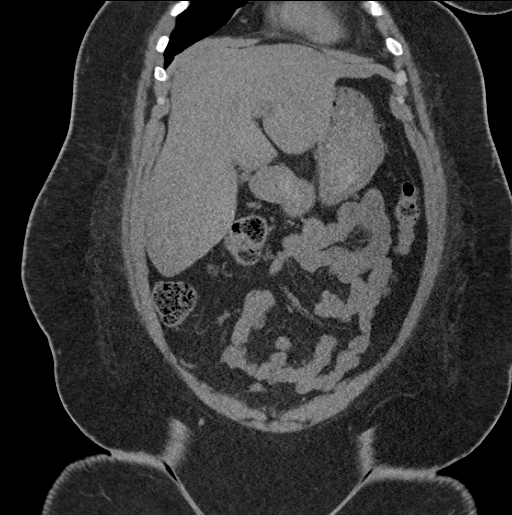
[im 45/101  soft-tissue]
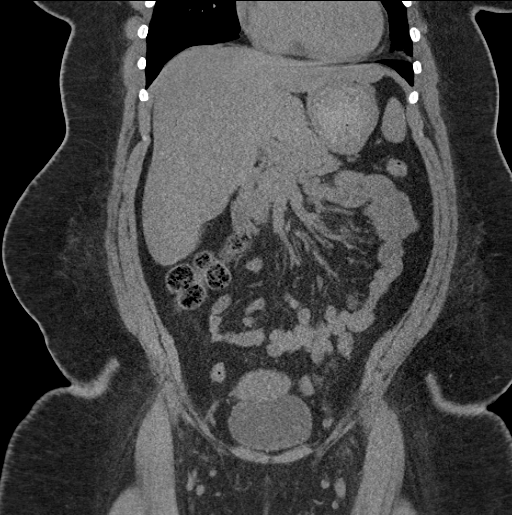
[im 56/101  soft-tissue]
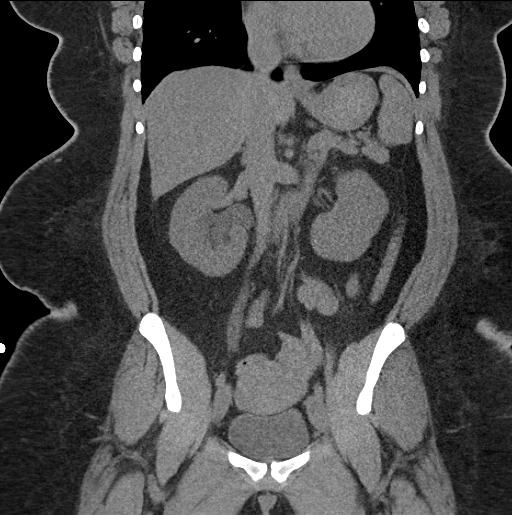

[17 of 46 positions shown; findings below may reference images not displayed]

FINDINGS: BODY WALL: No contributory findings.

LOWER CHEST: No contributory findings.

ABDOMEN/PELVIS:

Liver: Hepatic steatosis with central sparing. There is a prominent
caudate lobe which extends inferiorly ventral to the pancreas,
morphology stable from previous.

Biliary: No evidence of biliary obstruction or stone.

Pancreas: Unremarkable.

Spleen: Unremarkable.

Adrenals: Unremarkable.

Kidneys and ureters: 2 mm stone just above the right ureteral
vesicular junction with mild moderate hydroureteronephrosis. No
additional urolithiasis. No left hydronephrosis.

Bladder: Unremarkable.

Reproductive: Previously seen left ovarian dermoid is no longer
visible. Lobulated appearance the left ovary is presumably from
follicles as no dominant mass seen. The right ovary is not
visualized.

Bowel: No obstruction. Negative appendix.

Retroperitoneum: No mass or adenopathy.

Peritoneum: No ascites or pneumoperitoneum.

Vascular: No acute abnormality.

OSSEOUS: No acute abnormalities.
IMPRESSION: 1. 2 mm distal right ureteral stone with hydroureteronephrosis.
2. Hepatic steatosis.

## 2020-01-07 ENCOUNTER — Other Ambulatory Visit: Payer: Self-pay | Admitting: General Surgery

## 2020-01-17 ENCOUNTER — Ambulatory Visit (INDEPENDENT_AMBULATORY_CARE_PROVIDER_SITE_OTHER): Payer: BC Managed Care – PPO | Admitting: Internal Medicine

## 2020-01-17 ENCOUNTER — Encounter: Payer: Self-pay | Admitting: Internal Medicine

## 2020-01-17 ENCOUNTER — Other Ambulatory Visit: Payer: Self-pay

## 2020-01-17 VITALS — BP 134/84 | HR 84 | Ht 62.0 in | Wt 306.0 lb

## 2020-01-17 DIAGNOSIS — I10 Essential (primary) hypertension: Secondary | ICD-10-CM

## 2020-01-17 DIAGNOSIS — Z01810 Encounter for preprocedural cardiovascular examination: Secondary | ICD-10-CM | POA: Diagnosis not present

## 2020-01-17 DIAGNOSIS — E119 Type 2 diabetes mellitus without complications: Secondary | ICD-10-CM

## 2020-01-17 DIAGNOSIS — E7849 Other hyperlipidemia: Secondary | ICD-10-CM

## 2020-01-17 NOTE — Progress Notes (Signed)
Cardiology Office Note:    Date:  01/17/2020   ID:  Rob Bunting, DOB 10/31/1975, MRN [id number]  PCP:  System, Provider Not In  [facility] Cardiologist:  Christell Constant, MD  [facility] Electrophysiologist:  None   Referring MD: Gaynelle Adu, MD   CC: Getting read for bariatric surgery Consulted for the evaluation of preoperative risk stratification at the [facility] Surgery - Gaynelle Adu MD   History of Present Illness:    Gloria Kim is a 44 y.o. 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  No chest pain, shortness of breath, DOE, no syncope.  No Chest tightness or chest stinging.  Ambulatory BP- 130/80.  Past Medical History:  Diagnosis Date  . Asthma   . Constipation   . DM (diabetes mellitus) (HCC)   . Dyslipidemia   . GERD (gastroesophageal reflux disease)   . Hypertension   . Migraines   . Obesity   . Right knee pain     Past Surgical History:  Procedure Laterality Date  . BREAT BIOPSY    . CESAREAN SECTION    . MOUTH SURGERY    . PARTIAL HYSTERECTOMY      Current Medications: Current Meds  Medication Sig  . acetaminophen (TYLENOL) 325 MG tablet Take 975 mg by mouth every 6 (six) hours as needed for mild pain or moderate pain.  Marland Kitchen albuterol (PROVENTIL HFA;VENTOLIN HFA) 108 (90 BASE) MCG/ACT inhaler Inhale 2 puffs into the lungs every 6 (six) hours as needed for wheezing or shortness of breath.  Marland Kitchen albuterol (PROVENTIL) (2.5 MG/3ML) 0.083% nebulizer solution Take 3 mLs (2.5 mg total) by nebulization every 6 (six) hours as needed for wheezing or shortness of breath.  Marland Kitchen atorvastatin (LIPITOR) 10 MG tablet Take 10 mg by mouth daily.  Marland Kitchen lisinopril (ZESTRIL) 2.5 MG tablet Take 1 tablet by mouth daily.  . metFORMIN  (GLUCOPHAGE-XR) 500 MG 24 hr tablet TAKE 2 TABLETS BY MOUTH WITH BREAKFAST AND DINNER  . naproxen (NAPROSYN) 500 MG tablet Take 1 tablet (500 mg total) by mouth 2 (two) times daily as needed for mild pain, moderate pain or headache (TAKE WITH MEALS.).  Marland Kitchen oxyCODONE-acetaminophen (PERCOCET) 5-325 MG per tablet Take 1 tablet by mouth every 6 (six) hours as needed for severe pain.     Allergies:   Nickel, Tramadol, and Latex   Social History   Socioeconomic History  . Marital status: Single    Spouse name: Not on file  . Number of children: Not on file  . Years of education: Not on file  . Highest education level: Not on file  Occupational History  . Not on file  Tobacco Use  . Smoking status: Never Smoker  . Smokeless tobacco: Never Used  Substance and Sexual Activity  . Alcohol use: Yes  . Drug use: Not on file  . Sexual activity: Not on file  Other Topics Concern  . Not on file  Social History Narrative  . Not on file   Social Determinants of Health   Financial Resource Strain:   . Difficulty of Paying Living Expenses: Not on file  Food Insecurity:   . Worried About Programme researcher, broadcasting/film/video in the Last Year: Not on file  . Ran  Out of Food in the Last Year: Not on file  Transportation Needs:   . Lack of Transportation (Medical): Not on file  . Lack of Transportation (Non-Medical): Not on file  Physical Activity:   . Days of Exercise per Week: Not on file  . Minutes of Exercise per Session: Not on file  Stress:   . Feeling of Stress : Not on file  Social Connections:   . Frequency of Communication with Friends and Family: Not on file  . Frequency of Social Gatherings with Friends and Family: Not on file  . Attends Religious Services: Not on file  . Active Member of Clubs or Organizations: Not on file  . Attends Banker Meetings: Not on file  . Marital Status: Not on file   Wife works in school system.  Daughters go to [employer]   Family History: The patient's  family history includes Diabetes in her mother; Hypertension in her mother; Kidney disease in her mother.  ROS:   Please see the history of present illness.    All other systems reviewed and are negative.  EKGs/Labs/Other Studies Reviewed:    The following studies were reviewed today:  EKG:  EKG is ordered today.  The ekg ordered today demonstrates SR, rate 83, no ST/T changes  Cholesterol on statin Choelsterol112, triglycerides 85, VLDL 17, LDL 48   Physical Exam:    VS:  BP 134/84   Pulse 84   Ht 5\' 2"  (1.575 m)   Wt (!) 306 lb (138.8 kg)   SpO2 97%   BMI 55.97 kg/m     Wt Readings from Last 3 Encounters:  01/17/20 (!) 306 lb (138.8 kg)  12/25/13 275 lb (124.7 kg)     GEN: Obese female, well developed in no acute distress HEENT: Normal NECK: No JVD; No carotid bruits LYMPHATICS: No lymphadenopathy CARDIAC: RRR, no murmurs, rubs, gallops RESPIRATORY:  Clear to auscultation without rales, wheezing or rhonchi  ABDOMEN: Soft, non-tender, non-distended MUSCULOSKELETAL:  No edema; No deformity  SKIN: Warm and dry NEUROLOGIC:  Alert and oriented x 3 PSYCHIATRIC:  Normal affect   ASSESSMENT:    1. Preoperative cardiovascular examination   2. Diabetes mellitus with coincident hypertension (HCC)   3. Other hyperlipidemia   4. Morbid obesity (HCC)    PLAN:    In order of problems listed above:  Preoperative Risk Assessment w- DM with HTN, Morbid Obesity, and HLD - The Revised Cardiac Risk Index = 0 which equates to 0.4%: very low risk estimated risk of perioperative myocardial infarction, pulmonary edema, ventricular fibrillation, cardiac arrest, or complete heart block.  - DASI 42.7-  fMETS 7.99 suggesting reasonable active person - No further cardiac testing is recommended prior to surgery.  - The patient may proceed to surgery at acceptable risk.   - Our service is available as needed in the peri-operative period.  - HTN and HLD well controlled; will get follow up    3 year follow up unless new symptoms or abnormal test results warranting change in plan  Would be reasonable for Virtual Follow up  Would be reasonable for  APP Follow up    Medication Adjustments/Labs and Tests Ordered: Current medicines are reviewed at length with the patient today.  Concerns regarding medicines are outlined above.  No orders of the defined types were placed in this encounter.  No orders of the defined types were placed in this encounter.   There are no Patient Instructions on file for this visit.  Signed, Christell Constant, MD  01/17/2020 8:34 AM    [facility]

## 2020-01-17 NOTE — Patient Instructions (Signed)
Medication Instructions:  Your physician recommends that you continue on your current medications as directed. Please refer to the Current Medication list given to you today.  *If you need a refill on your cardiac medications before your next appointment, please call your pharmacy*   Lab Work: none If you have labs (blood work) drawn today and your tests are completely normal, you will receive your results only by: Marland Kitchen MyChart Message (if you have MyChart) OR . A paper copy in the mail If you have any lab test that is abnormal or we need to change your treatment, we will call you to review the results.   Testing/Procedures: none   Follow-Up: At [facility], you and your health needs are our priority.  As part of our continuing mission to provide you with exceptional heart care, we have created designated Provider Care Teams.  These Care Teams include your primary Cardiologist (physician) and Advanced Practice Providers (APPs -  Physician Assistants and Nurse Practitioners) who all work together to provide you with the care you need, when you need it.  We recommend signing up for the patient portal called "MyChart".  Sign up information is provided on this After Visit Summary.  MyChart is used to connect with patients for Virtual Visits (Telemedicine).  Patients are able to view lab/test results, encounter notes, upcoming appointments, etc.  Non-urgent messages can be sent to your provider as well.   To learn more about what you can do with MyChart, go to [email or web address].    Your next appointment:   3 year(s)  The format for your next appointment:   In Person  Provider:   You may see Christell Constant, MD or one of the following Advanced Practice Providers on your designated Care Team:    Ronie Spies, PA-C  Jacolyn Reedy, PA-C    Other Instructions

## 2020-01-22 ENCOUNTER — Ambulatory Visit (HOSPITAL_COMMUNITY)
Admission: RE | Admit: 2020-01-22 | Discharge: 2020-01-22 | Disposition: A | Discharge status: Home / Self Care | Payer: BC Managed Care – PPO | Source: Ambulatory Visit | Attending: General Surgery | Admitting: General Surgery

## 2020-01-22 ENCOUNTER — Other Ambulatory Visit: Payer: Self-pay

## 2020-02-06 ENCOUNTER — Encounter: Payer: BC Managed Care – PPO | Attending: General Surgery | Admitting: Skilled Nursing Facility1

## 2020-02-06 ENCOUNTER — Encounter: Payer: Self-pay | Admitting: Skilled Nursing Facility1

## 2020-02-06 ENCOUNTER — Other Ambulatory Visit: Payer: Self-pay

## 2020-02-06 DIAGNOSIS — E119 Type 2 diabetes mellitus without complications: Secondary | ICD-10-CM | POA: Diagnosis not present

## 2020-02-06 DIAGNOSIS — I10 Essential (primary) hypertension: Secondary | ICD-10-CM | POA: Insufficient documentation

## 2020-02-06 NOTE — Progress Notes (Signed)
Nutrition Assessment for Bariatric Surgery Medical Nutrition Therapy Appt Start Time: 3:21 End Time: 4:21  Patient was seen on 02/06/2020 for Pre-Operative Nutrition Assessment. Letter of approval faxed to [facility] bariatric surgery program coordinator on 02/06/2020  Referral stated Supervised Weight Loss (SWL) visits needed: 0  Planned surgery: RYGB Pt expectation of surgery: to lose weeght Pt expectation of dietitian: to help educate   Nutrition clearance: Pt seems to be properly prepared for surgery and understands the necessary changes needed for success with surgery    NUTRITION ASSESSMENT   Anthropometrics  Start weight at [facility]: 309.8 lbs (date: 02/06/2020)  Height: 62 in BMI: 56.66 kg/m2     Clinical  Medical hx: diabetes, PCOS, GERD Medications: metformin   Labs: A1C 5.9 Notable signs/symptoms: reflux, migraines, constipation  Any previous deficiencies? No  Micronutrient Nutrition Focused Physical Exam: Hair: No issues observed Eyes: No issues observed Mouth: No issues observed Neck: No issues observed Nails: No issues observed Skin: No issues observed  Lifestyle & Dietary Hx   Pt states her A1C was 8 or 9 at some point but was able to get it down to 5.9 by cutting out soda.  Pt states she currently does not check her blood sugar.  Pt states she may have a kidney stone.    24-Hr Dietary Recall First Meal: 2 boiled eggs + 1/2 bagel Snack: apple Second Meal: sandwhich + chips Snack: grapes Third Meal: chicken + potatoes or rice + green beans Snack:  popcorn Beverages: coffee + sugar + flavored creamer, water   Estimated Energy Needs Calories: 1400   NUTRITION DIAGNOSIS  Overweight/obesity ([place]-3.3) related to past poor dietary habits and physical inactivity as evidenced by patient w/ planned RYGB surgery following dietary guidelines for continued weight loss.    NUTRITION INTERVENTION  Nutrition counseling (C-1) and education (E-2)  to facilitate bariatric surgery goals.   Pre-Op Goals Reviewed with the Patient . Track food and beverage intake (pen and paper, MyFitness Pal, Baritastic app, etc.) . Make healthy food choices while monitoring portion sizes . Consume 3 meals per day or try to eat every 3-5 hours . Avoid concentrated sugars and fried foods . Keep sugar & fat in the single digits per serving on food labels . Practice CHEWING your food (aim for applesauce consistency) . Practice not drinking 15 minutes before, during, and 30 minutes after each meal and snack . Avoid all carbonated beverages (ex: soda, sparkling beverages)  . Limit caffeinated beverages (ex: coffee, tea, energy drinks) . Avoid all sugar-sweetened beverages (ex: regular soda, sports drinks)  . Avoid alcohol  . Aim for 64-100 ounces of FLUID daily (with at least half of fluid intake being plain water)  . Aim for at least 60-80 grams of PROTEIN daily . Look for a liquid protein source that contains ?15 g protein and ?5 g carbohydrate (ex: shakes, drinks, shots) . Make a list of non-food related activities . Physical activity is an important part of a healthy lifestyle so keep it moving! The goal is to reach 150 minutes of exercise per week, including cardiovascular and weight baring activity.  *Goals that are bolded indicate the pt would like to start working towards these  Handouts Provided Include  . Bariatric Surgery handouts (Nutrition Visits, Pre-Op Goals, Protein Shakes, Vitamins & Minerals)  Learning Style & Readiness for Change Teaching method utilized: Visual & Auditory  Demonstrated degree of understanding via: Teach Back  Barriers to learning/adherence to lifestyle change: none identified  MONITORING & EVALUATION Dietary intake, weekly physical activity, body weight, and pre-op goals reached at next nutrition visit.    Next Steps  Patient is to follow up at [facility] for Pre-Op Class >2 weeks before surgery for further  nutrition education.

## 2020-04-13 ENCOUNTER — Encounter: Payer: BC Managed Care – PPO | Attending: General Surgery | Admitting: Skilled Nursing Facility1

## 2020-04-13 ENCOUNTER — Other Ambulatory Visit: Payer: Self-pay

## 2020-04-13 DIAGNOSIS — I10 Essential (primary) hypertension: Secondary | ICD-10-CM | POA: Insufficient documentation

## 2020-04-13 DIAGNOSIS — E119 Type 2 diabetes mellitus without complications: Secondary | ICD-10-CM | POA: Insufficient documentation

## 2020-04-13 NOTE — Progress Notes (Signed)
Supervised Weight Loss Visit Bariatric Nutrition Education  Planned Surgery: RYGB  1 out of 2 SWL Appointments   NUTRITION ASSESSMENT  Anthropometrics  Start weight at [facility]: 309.8 lbs (date: 02/06/2020) Weight today: 310.6 BMI: 56.83  Medical hx: diabetes, PCOS, GERD Medications: metformin   Labs: A1C 6.7, B12 269,  Notable signs/symptoms: reflux, migraines, constipation  Any previous deficiencies? Yes, B12  Lifestyle & Dietary Hx  Pt states she was put on b12 due to being low.  Pt states she has been craving salt lately.   Estimated daily fluid intake:  oz Supplements: b12 Current average weekly physical activity:   24-Hr Dietary Recall First Meal: boiled eggs and coffee + creamer Snack: fruit (banana or apple or grapes) Second Meal: ham and mustard sandwich + pretzels  Snack: popcorn Third Meal: fish or chicken + potatoes + green veggie Snack: chips Beverages: water  Estimated Energy Needs Calories:1400   NUTRITION DIAGNOSIS  Overweight/obesity ([place]-3.3) related to past poor dietary habits and physical inactivity as evidenced by patient w/ planned RYGB surgery following dietary guidelines for continued weight loss.   NUTRITION INTERVENTION  Nutrition counseling (C-1) and education (E-2) to facilitate bariatric surgery goals.  Pre-Op Goals Progress & New Goals . NEW: Eat until satisfaction . NEW: follow the MyPlate method for portions  . NEW: finish dinner before 7pm  Handouts Provided Include   Detailed MyPlate  Learning Style & Readiness for Change Teaching method utilized: Visual & Auditory  Demonstrated degree of understanding via: Teach Back  Readiness Level: Precontompletaive  Barriers to learning/adherence to lifestyle change: none identified   RD's Notes for next Visit  . Assess pts adherence to chosen goals   MONITORING & EVALUATION Dietary intake, weekly physical activity, body weight, and pre-op goals in 1 month.   Next Steps  Patient  is to return to [facility]

## 2020-05-11 ENCOUNTER — Encounter: Payer: Self-pay | Admitting: Skilled Nursing Facility1

## 2020-05-11 ENCOUNTER — Ambulatory Visit: Payer: BC Managed Care – PPO | Admitting: Skilled Nursing Facility1

## 2020-05-11 ENCOUNTER — Telehealth: Payer: Medicaid Other | Admitting: Skilled Nursing Facility1

## 2020-05-21 ENCOUNTER — Ambulatory Visit: Payer: Medicaid Other | Admitting: Skilled Nursing Facility1

## 2020-05-21 ENCOUNTER — Encounter: Payer: BC Managed Care – PPO | Attending: General Surgery | Admitting: Skilled Nursing Facility1

## 2020-05-21 DIAGNOSIS — E119 Type 2 diabetes mellitus without complications: Secondary | ICD-10-CM | POA: Insufficient documentation

## 2020-05-21 DIAGNOSIS — I10 Essential (primary) hypertension: Secondary | ICD-10-CM

## 2020-05-21 NOTE — Progress Notes (Signed)
Supervised Weight Loss Visit Bariatric Nutrition Education  Planned Surgery: RYGB  2 out of 2 SWL Appointments Pt has completed her visits  NUTRITION ASSESSMENT  Anthropometrics  Start weight at [facility]: 309.8 lbs (date: 02/06/2020) Weight today: 301 (virtual visit pt stated) BMI: 55.05  Medical hx: diabetes, PCOS, GERD Medications: metformin   Labs: A1C 6.7, B12 269,  Notable signs/symptoms: reflux, migraines, constipation  Any previous deficiencies? Yes, B12  Lifestyle & Dietary Hx  Newborn at home. and has custody of her niece and nephew  Pt states she is getting over Covid stating she feels better now. Pt states the MyPlate method has worked well for her for portions. Pt states she can now tell when she has had enough.   Pt states after surgery she may struggle with eating before 7pm. Pt states she feels she will be successful with following the diet after surgery with a strong motivation.   Estimated daily fluid intake:  oz Supplements: b12 Current average weekly physical activity:   24-Hr Dietary Recall First Meal: boiled eggs and coffee + creamer or 2 waffles + syrup Snack: fruit (banana or apple or grapes) Second Meal: ham and mustard sandwich + pretzels or grapes + sandwich Snack: popcorn or handful chips or cereal Third Meal: fish or chicken + potatoes + green veggie  or homemade hamburger + rice + greens + wheat bread Snack: chips Beverages: water, coffee, hot tea, soda, Gatorade, cranberry juice  Estimated Energy Needs Calories:1400  NUTRITION DIAGNOSIS  Overweight/obesity ([place]-3.3) related to past poor dietary habits and physical inactivity as evidenced by patient w/ planned RYGB surgery following dietary guidelines for continued weight loss.   NUTRITION INTERVENTION  Nutrition counseling (C-1) and education (E-2) to facilitate bariatric surgery goals.  Pre-Op Goals Progress & New Goals . continue: Eat until satisfaction . Continue: follow the MyPlate  method for portions  . continue: finish dinner before 7pm  Handouts previously Provided Include   Detailed MyPlate  Learning Style & Readiness for Change Teaching method utilized: Visual & Auditory  Demonstrated degree of understanding via: Teach Back  Readiness Level: Precontompletaive  Barriers to learning/adherence to lifestyle change: none identified   RD's Notes for next Visit  . Assess pts adherence to chosen goals   MONITORING & EVALUATION Dietary intake, weekly physical activity, body weight, and pre-op goals in 1 month.   Next Steps  Patient is to return to [facility]

## 2020-06-15 ENCOUNTER — Encounter: Payer: BC Managed Care – PPO | Admitting: Skilled Nursing Facility1

## 2020-06-15 ENCOUNTER — Other Ambulatory Visit: Payer: Self-pay

## 2020-06-15 DIAGNOSIS — I10 Essential (primary) hypertension: Secondary | ICD-10-CM

## 2020-06-15 DIAGNOSIS — E119 Type 2 diabetes mellitus without complications: Secondary | ICD-10-CM | POA: Diagnosis not present

## 2020-06-17 NOTE — Progress Notes (Signed)
Pre-Operative Nutrition Class:  Appt start time: 9102   End time:  1830.  Patient was seen on 06/15/2020 for Pre-Operative Bariatric Surgery [facility]    Surgery date:  Surgery type: RYGB Start weight at [facility]: 309.8 Weight today: virtual class; pt identified via name and DOB, pt agrees to the limitations of this visit type  The following the learning objectives were met by the patient during this course:  Identify Pre-Op Dietary Goals and will begin 2 weeks pre-operatively  Identify appropriate sources of fluids and proteins   State protein recommendations and appropriate sources pre and post-operatively  Identify Post-Operative Dietary Goals and will follow for 2 weeks post-operatively  Identify appropriate multivitamin and calcium sources  Describe the need for physical activity post-operatively and will follow MD recommendations  State when to call healthcare provider regarding medication questions or post-operative complications  Handouts given during class include:  Pre-Op Bariatric Surgery Diet Handout  Protein Shake Handout  Post-Op Bariatric Surgery Nutrition Handout  [facility] Program Information Flyer  Support Group Information Flyer  [facility] Bariatric Supplements Price List  Follow-Up Plan: Patient will follow-up at [facility] 2 weeks post operatively for diet advancement per MD.

## 2020-06-19 ENCOUNTER — Ambulatory Visit: Payer: Self-pay | Admitting: General Surgery

## 2020-06-19 NOTE — H&P (View-Only) (Signed)
Gloria Kim Appointment: 06/19/2020 8:45 AM Location: [facility] Patient #: [id number] DOB: 04-26-1975 Married / Language: English / Race: Black or African American Female  History of Present Illness Gloria Hiss M. Gloria Gramajo Kim; 06/19/2020 9:08 AM) The patient is a 45 year old female who presents for a bariatric surgery evaluation. She comes in for long-term follow-up regarding her severe obesity. She was initially met in September 2021 to discuss bariatric surgery. She has completed the pathway. She received nutritional, psychological, and cardiac clearance. She denies any medical changes since she was last seen. She did contract covid at the end of January. She did not require hospitalization. She was mildly symptomatic. She states that she received a shot of steroids. Otherwise she denies any chest pain, chest pressure, shrubs breath, abdominal pain. No tobacco use. Her upper GI showed a small amount occurred. Chest x-ray unremarkable. Labs from November 2021 ordered by her PCP as well as are labs from October 2021 reviewed. No abnormalities. Normal lipid panel. Normal CBC, normal comprehensive metabolic panel. Normal B12. She is no longer on insulin. She is just on metformin. She states her blood sugars have been all less than 200. Her last A1c that I have access to was 6.6   01/07/20 She is referred by Gloria Marrow Kim for evaluation of bariatric surgery. She completed our seminar on line. She is interested in Roux-en-Y gastric bypass. She is interested in improving her overall health. Despite numerous attempts for sustained weight loss she has been unsuccessful. She has tried behavior modification, Weight Watchers on several occasions, low calorie diets on numerous occasions, intermittent fasting as well as working with her primary care team-all without long-term success  Her comorbidities include hypertension, dyslipidemia, asthma, GERD, diabetes mellitus since 2018,  migraines, right knee pain  She she does endorse some chest pain especially when breathing and, some shortness of breath and some dyspnea on exertion. She states that when walking around in a grocery store she will get short of breath & will sweat. She believes the symptoms are primarily due to her underlying asthma. She denies any orthopnea, paroxysmal nocturnal dyspnea. No prior personal history of blood clots. Some occasional ankle edema left greater than right. She states that she had a sleep study and it was inconclusive. She does have reflux which she describes as burning in her chest, sometimes burping as well as some discomfort in her upper abdomen. It'll be relieved with Tums and Rolaids. She does have constipation issues which have been chronic. She averages a bowel movement maybe once a week. She is on linzess. She may have some straining with bowel movements. Only abdominal surgery C-section 2. No dysuria or hematuria. She has right knee pain and left ankle pain. She had carpal tunnel surgery on her right hand. She has been a diabetic since 2018 not on insulin. She is migraines.  No TIAs or amaurosis fugax. No tobacco or drugs. Rare alcohol. She works in Librarian, academic.    Problem List/Past Medical Gloria Kim; 06/19/2020 9:09 AM) DIABETES MELLITUS TYPE 2 IN OBESE (E11.69) GASTROESOPHAGEAL REFLUX DISEASE, UNSPECIFIED WHETHER ESOPHAGITIS PRESENT (K21.9) CHRONIC ASTHMA, UNSPECIFIED ASTHMA SEVERITY, UNSPECIFIED WHETHER COMPLICATED, UNSPECIFIED WHETHER PERSISTENT (J45.909) CONSTIPATION IN FEMALE (K59.00) SEVERE OBESITY (E66.01) Patient meets weight loss surgery criteria. I think she is an acceptable candidate for Roux-en-Y gastric bypass She was previously given a copy of her MBSAQIP risk-benefit outcome sheet which we discussed  This patient encounter took 24 minutes today to perform the following: take  history, perform exam, review outside records, interpret  imaging, counsel the patient on their diagnosis and discuss risk/benefits/life after surgery HYPERTENSION, ESSENTIAL (I10) HYPERCHOLESTEREMIA (E78.00)  Past Surgical History Gloria Kim; 06/19/2020 9:09 AM) Breast Biopsy Left. Cesarean Section - Multiple Oral Surgery  Diagnostic Studies History Gloria Kim; 06/19/2020 9:09 AM) Colonoscopy within last year Mammogram within last year Pap Smear 1-5 years ago  Allergies Gloria Kim; 06/19/2020 9:09 AM) traMADol HCl *ANALGESICS - OPIOID* Nickel Latex  Medication History Gloria Kim; 06/19/2020 9:09 AM) Lisinopril (2.5MG Tablet, Oral) Active. metFORMIN HCl ER (500MG Tablet ER 24HR, Oral) Active. Medications Reconciled Lipitor (10MG Tablet, Oral) Active.  Social History Gloria Kim; 06/19/2020 9:09 AM) Alcohol use Occasional alcohol use. Caffeine use Coffee. Tobacco use Never smoker.  Family History Gloria Kim; 06/19/2020 9:09 AM) Diabetes Mellitus Mother. Hypertension Mother. Kidney Disease Mother.  Pregnancy / Birth History Gloria Kim; 06/19/2020 9:09 AM) Age at menarche 3 years. Contraceptive History Contraceptive implant, Oral contraceptives. Gravida 4 Irregular periods Maternal age <15 Para 2  Other Problems Gloria Kim; 06/19/2020 9:09 AM) Anxiety Disorder Asthma Back Pain Bladder Problems Chest pain Depression Diabetes Mellitus Gastroesophageal Reflux Disease Hemorrhoids High blood pressure Hypercholesterolemia Kidney Stone Migraine Headache Oophorectomy Right.     Review of Systems Gloria Hiss M. Gloria Kim; 06/19/2020 9:06 AM) All other systems negative  Vitals (Gloria Kim; 06/19/2020 8:45 AM) 06/19/2020 8:45 AM Weight: 311.4 lb Height: 62in Body Surface Area: 2.31 m Body Mass Index: 56.96 kg/m  Pulse: 120 (Regular)  BP: 130/82(Sitting, Left Arm, Standard)        Physical Exam Gloria Hiss M.  Gloria Kim; 06/19/2020 9:06 AM)  The physical exam findings are as follows: Note:central truncal obesity  General Mental Status-Alert. General Appearance-Consistent with stated age. Hydration-Well hydrated. Voice-Normal.  Head and Neck Head-normocephalic, atraumatic with no lesions or palpable masses. Trachea-midline. Thyroid Gland Characteristics - normal size and consistency.  Eye Eyeball - Bilateral-Extraocular movements intact. Sclera/Conjunctiva - Bilateral-No scleral icterus.  Chest and Lung Exam Chest and lung exam reveals -quiet, even and easy respiratory effort with no use of accessory muscles and on auscultation, normal breath sounds, no adventitious sounds and normal vocal resonance. Inspection Chest Wall - Normal. Back - normal.  Breast - Did not examine.  Cardiovascular Cardiovascular examination reveals -normal heart sounds, regular rate and rhythm with no murmurs and normal pedal pulses bilaterally.  Abdomen Inspection Inspection of the abdomen reveals - No Hernias. Skin - Scar - Note: old c/s scar. Palpation/Percussion Palpation and Percussion of the abdomen reveal - Soft, Non Tender, No Rebound tenderness, No Rigidity (guarding) and No hepatosplenomegaly. Auscultation Auscultation of the abdomen reveals - Bowel sounds normal.  Peripheral Vascular Upper Extremity Palpation - Pulses bilaterally normal.  Neurologic Neurologic evaluation reveals -alert and oriented x 3 with no impairment of recent or remote memory. Mental Status-Normal.  Neuropsychiatric The patient's mood and affect are described as -normal. Judgment and Insight-insight is appropriate concerning matters relevant to self.  Musculoskeletal Normal Exam - Left-Upper Extremity Strength Normal and Lower Extremity Strength Normal. Normal Exam - Right-Upper Extremity Strength Normal and Lower Extremity Strength Normal.  Lymphatic Head & Neck  General Head &  Neck Lymphatics: Bilateral - Description - Normal. Axillary - Did not examine. Femoral & Inguinal - Did not examine.    Assessment & Plan Gloria Hiss M. Keonna Raether Kim; 06/19/2020 9:06 AM)  DIABETES MELLITUS TYPE 2 IN OBESE (E11.69) Impression: last a1c  was 6.6   SEVERE OBESITY (E66.01) Story: Patient meets weight loss surgery criteria. I think she is an acceptable candidate for Roux-en-Y gastric bypass She was previously given a copy of her MBSAQIP risk-benefit outcome sheet which we discussed  This patient encounter took 24 minutes today to perform the following: take history, perform exam, review outside records, interpret imaging, counsel the patient on their diagnosis and discuss risk/benefits/life after surgery Impression: The patient meets weight loss surgery criteria. I think the patient would be an acceptable candidate for Laparoscopic Roux-en-Y Gastric bypass.  We rediscussed laparoscopic Roux-en-Y gastric bypass. We rediscussed the preoperative, operative and postoperative process. We reviewed her workup. We discussed her labs, imaging, cardiology, nutritional and psychological evaluation. We discussed the typical hospital course as well as the typical recovery period. We discussed the importance of the preoperative Milbrandt. All her questions were asked and answered  Current Plans Pt Education - EMW_preopbariatric  CONSTIPATION IN FEMALE (K59.00) Impression: We did discuss that constipation is common after gastric bypass and that she will more than likely need to remain on her prescription medication   CHRONIC ASTHMA, UNSPECIFIED ASTHMA SEVERITY, UNSPECIFIED WHETHER COMPLICATED, UNSPECIFIED WHETHER PERSISTENT (J45.909)   HYPERTENSION, ESSENTIAL (I10) Impression: was low risk was seen by cards in fall 2021   HYPERCHOLESTEREMIA (E78.00) Impression: well controlled on last lab check in 11/21   [place], UNSPECIFIED WHETHER ESOPHAGITIS PRESENT (K21.9)  Leighton Ruff. Redmond Pulling, Kim, FACS General, Bariatric, & Minimally Invasive Surgery [facility], [place]

## 2020-06-19 NOTE — H&P (Signed)
Delight Stare Appointment: 06/19/2020 8:45 AM Location: [facility] Patient #: [id number] DOB: [birth year] Married / Language: English / Race: Black or African American Female  History of Present Illness Randall Hiss M. Jazon Jipson MD; 06/19/2020 9:08 AM) The patient is a 45 year old female who presents for a bariatric surgery evaluation. She comes in for long-term follow-up regarding her severe obesity. She was initially met in September 2021 to discuss bariatric surgery. She has completed the pathway. She received nutritional, psychological, and cardiac clearance. She denies any medical changes since she was last seen. She did contract covid at the end of January. She did not require hospitalization. She was mildly symptomatic. She states that she received a shot of steroids. Otherwise she denies any chest pain, chest pressure, shrubs breath, abdominal pain. No tobacco use. Her upper GI showed a small amount occurred. Chest x-ray unremarkable. Labs from November 2021 ordered by her PCP as well as are labs from October 2021 reviewed. No abnormalities. Normal lipid panel. Normal CBC, normal comprehensive metabolic panel. Normal B12. She is no longer on insulin. She is just on metformin. She states her blood sugars have been all less than 200. Her last A1c that I have access to was 6.6   01/07/20 She is referred by Lollie Marrow MD for evaluation of bariatric surgery. She completed our seminar on line. She is interested in Roux-en-Y gastric bypass. She is interested in improving her overall health. Despite numerous attempts for sustained weight loss she has been unsuccessful. She has tried behavior modification, Weight Watchers on several occasions, low calorie diets on numerous occasions, intermittent fasting as well as working with her primary care team-all without long-term success  Her comorbidities include hypertension, dyslipidemia, asthma, GERD, diabetes mellitus since 2018,  migraines, right knee pain  She she does endorse some chest pain especially when breathing and, some shortness of breath and some dyspnea on exertion. She states that when walking around in a grocery store she will get short of breath & will sweat. She believes the symptoms are primarily due to her underlying asthma. She denies any orthopnea, paroxysmal nocturnal dyspnea. No prior personal history of blood clots. Some occasional ankle edema left greater than right. She states that she had a sleep study and it was inconclusive. She does have reflux which she describes as burning in her chest, sometimes burping as well as some discomfort in her upper abdomen. It'll be relieved with Tums and Rolaids. She does have constipation issues which have been chronic. She averages a bowel movement maybe once a week. She is on linzess. She may have some straining with bowel movements. Only abdominal surgery C-section 2. No dysuria or hematuria. She has right knee pain and left ankle pain. She had carpal tunnel surgery on her right hand. She has been a diabetic since 2018 not on insulin. She is migraines.  No TIAs or amaurosis fugax. No tobacco or drugs. Rare alcohol. She works in Librarian, academic.    Problem List/Past Medical Randall Hiss M. Redmond Pulling, MD; 06/19/2020 9:09 AM) DIABETES MELLITUS TYPE 2 IN OBESE (E11.69) GASTROESOPHAGEAL REFLUX DISEASE, UNSPECIFIED WHETHER ESOPHAGITIS PRESENT (K21.9) CHRONIC ASTHMA, UNSPECIFIED ASTHMA SEVERITY, UNSPECIFIED WHETHER COMPLICATED, UNSPECIFIED WHETHER PERSISTENT (J45.909) CONSTIPATION IN FEMALE (K59.00) SEVERE OBESITY (E66.01) Patient meets weight loss surgery criteria. I think she is an acceptable candidate for Roux-en-Y gastric bypass She was previously given a copy of her MBSAQIP risk-benefit outcome sheet which we discussed  This patient encounter took 24 minutes today to perform the following: take  history, perform exam, review outside records, interpret  imaging, counsel the patient on their diagnosis and discuss risk/benefits/life after surgery HYPERTENSION, ESSENTIAL (I10) HYPERCHOLESTEREMIA (E78.00)  Past Surgical History Randall Hiss M. Redmond Pulling, MD; 06/19/2020 9:09 AM) Breast Biopsy Left. Cesarean Section - Multiple Oral Surgery  Diagnostic Studies History Randall Hiss M. Redmond Pulling, MD; 06/19/2020 9:09 AM) Colonoscopy within last year Mammogram within last year Pap Smear 1-5 years ago  Allergies Randall Hiss M. Redmond Pulling, MD; 06/19/2020 9:09 AM) traMADol HCl *ANALGESICS - OPIOID* Nickel Latex  Medication History Randall Hiss M. Redmond Pulling, MD; 06/19/2020 9:09 AM) Lisinopril (2.5MG Tablet, Oral) Active. metFORMIN HCl ER (500MG Tablet ER 24HR, Oral) Active. Medications Reconciled Lipitor (10MG Tablet, Oral) Active.  Social History Randall Hiss M. Redmond Pulling, MD; 06/19/2020 9:09 AM) Alcohol use Occasional alcohol use. Caffeine use Coffee. Tobacco use Never smoker.  Family History Randall Hiss M. Redmond Pulling, MD; 06/19/2020 9:09 AM) Diabetes Mellitus Mother. Hypertension Mother. Kidney Disease Mother.  Pregnancy / Birth History Randall Hiss M. Redmond Pulling, MD; 06/19/2020 9:09 AM) Age at menarche 70 years. Contraceptive History Contraceptive implant, Oral contraceptives. Gravida 4 Irregular periods Maternal age <15 Para 2  Other Problems Randall Hiss M. Redmond Pulling, MD; 06/19/2020 9:09 AM) Anxiety Disorder Asthma Back Pain Bladder Problems Chest pain Depression Diabetes Mellitus Gastroesophageal Reflux Disease Hemorrhoids High blood pressure Hypercholesterolemia Kidney Stone Migraine Headache Oophorectomy Right.     Review of Systems Randall Hiss M. Skylor Schnapp MD; 06/19/2020 9:06 AM) All other systems negative  Vitals (Alisha Spillers CMA; 06/19/2020 8:45 AM) 06/19/2020 8:45 AM Weight: 311.4 lb Height: 62in Body Surface Area: 2.31 m Body Mass Index: 56.96 kg/m  Pulse: 120 (Regular)  BP: 130/82(Sitting, Left Arm, Standard)        Physical Exam Randall Hiss M.  Adline Kirshenbaum MD; 06/19/2020 9:06 AM)  The physical exam findings are as follows: Note:central truncal obesity  General Mental Status-Alert. General Appearance-Consistent with stated age. Hydration-Well hydrated. Voice-Normal.  Head and Neck Head-normocephalic, atraumatic with no lesions or palpable masses. Trachea-midline. Thyroid Gland Characteristics - normal size and consistency.  Eye Eyeball - Bilateral-Extraocular movements intact. Sclera/Conjunctiva - Bilateral-No scleral icterus.  Chest and Lung Exam Chest and lung exam reveals -quiet, even and easy respiratory effort with no use of accessory muscles and on auscultation, normal breath sounds, no adventitious sounds and normal vocal resonance. Inspection Chest Wall - Normal. Back - normal.  Breast - Did not examine.  Cardiovascular Cardiovascular examination reveals -normal heart sounds, regular rate and rhythm with no murmurs and normal pedal pulses bilaterally.  Abdomen Inspection Inspection of the abdomen reveals - No Hernias. Skin - Scar - Note: old c/s scar. Palpation/Percussion Palpation and Percussion of the abdomen reveal - Soft, Non Tender, No Rebound tenderness, No Rigidity (guarding) and No hepatosplenomegaly. Auscultation Auscultation of the abdomen reveals - Bowel sounds normal.  Peripheral Vascular Upper Extremity Palpation - Pulses bilaterally normal.  Neurologic Neurologic evaluation reveals -alert and oriented x 3 with no impairment of recent or remote memory. Mental Status-Normal.  Neuropsychiatric The patient's mood and affect are described as -normal. Judgment and Insight-insight is appropriate concerning matters relevant to self.  Musculoskeletal Normal Exam - Left-Upper Extremity Strength Normal and Lower Extremity Strength Normal. Normal Exam - Right-Upper Extremity Strength Normal and Lower Extremity Strength Normal.  Lymphatic Head & Neck  General Head &  Neck Lymphatics: Bilateral - Description - Normal. Axillary - Did not examine. Femoral & Inguinal - Did not examine.    Assessment & Plan Randall Hiss M. Deeandra Jerry MD; 06/19/2020 9:06 AM)  DIABETES MELLITUS TYPE 2 IN OBESE (E11.69) Impression: last a1c  was 6.6   SEVERE OBESITY (E66.01) Story: Patient meets weight loss surgery criteria. I think she is an acceptable candidate for Roux-en-Y gastric bypass She was previously given a copy of her MBSAQIP risk-benefit outcome sheet which we discussed  This patient encounter took 24 minutes today to perform the following: take history, perform exam, review outside records, interpret imaging, counsel the patient on their diagnosis and discuss risk/benefits/life after surgery Impression: The patient meets weight loss surgery criteria. I think the patient would be an acceptable candidate for Laparoscopic Roux-en-Y Gastric bypass.  We rediscussed laparoscopic Roux-en-Y gastric bypass. We rediscussed the preoperative, operative and postoperative process. We reviewed her workup. We discussed her labs, imaging, cardiology, nutritional and psychological evaluation. We discussed the typical hospital course as well as the typical recovery period. We discussed the importance of the preoperative Milbrandt. All her questions were asked and answered  Current Plans Pt Education - EMW_preopbariatric  CONSTIPATION IN FEMALE (K59.00) Impression: We did discuss that constipation is common after gastric bypass and that she will more than likely need to remain on her prescription medication   CHRONIC ASTHMA, UNSPECIFIED ASTHMA SEVERITY, UNSPECIFIED WHETHER COMPLICATED, UNSPECIFIED WHETHER PERSISTENT (J45.909)   HYPERTENSION, ESSENTIAL (I10) Impression: was low risk was seen by cards in fall 2021   HYPERCHOLESTEREMIA (E78.00) Impression: well controlled on last lab check in 11/21   [place], UNSPECIFIED WHETHER ESOPHAGITIS PRESENT (K21.9)  Leighton Ruff. Redmond Pulling, MD, FACS General, Bariatric, & Minimally Invasive Surgery [facility], [place]

## 2020-06-26 NOTE — Progress Notes (Signed)
DUE TO COVID-19 ONLY ONE VISITOR IS ALLOWED TO COME WITH YOU AND STAY IN THE WAITING ROOM ONLY DURING PRE OP AND PROCEDURE DAY OF SURGERY. THE 1 VISITOR  MAY VISIT WITH YOU AFTER SURGERY IN YOUR PRIVATE ROOM DURING VISITING HOURS ONLY!  YOU NEED TO HAVE A COVID 19 TEST ON__3/18/2022 _____ @_______ , THIS TEST MUST BE DONE BEFORE SURGERY,  COVID TESTING SITE [address] [place] , IT IS ON THE RIGHT GOING OUT [address] APPROXIMATELY  2 MINUTES PAST [employer] ON THE RIGHT. ONCE YOUR COVID TEST IS COMPLETED,  PLEASE BEGIN THE QUARANTINE INSTRUCTIONS AS OUTLINED IN YOUR HANDOUT.                Gloria Kim  06/26/2020   Your procedure is scheduled on: 07/07/2020    Report to [facility] Main  Entrance   Report to admitting at      0845 am    Call this number if you have problems the morning of surgery [phone]    REMEMBER: NO  SOLID FOOD CANDY OR GUM AFTER MIDNIGHT. CLEAR LIQUIDS UNTIL 0715am         . NOTHING BY MOUTH EXCEPT CLEAR LIQUIDS UNTIL      0745am   . PLEASE FINISH ENSURE DRINK PER SURGEON ORDER  WHICH NEEDS TO BE COMPLETED AT 0745am     .      CLEAR LIQUID DIET   Foods Allowed                                                                    Coffee and tea, regular and decaf                            Fruit ices (not with fruit pulp)                                      Iced Popsicles                                    Carbonated beverages, regular and diet                                    Cranberry, grape and apple juices Sports drinks like Gatorade Lightly seasoned clear broth or consume(fat free) Sugar, honey syrup ___________________________________________________________________      BRUSH YOUR TEETH MORNING OF SURGERY AND RINSE YOUR MOUTH OUT, NO CHEWING GUM CANDY OR MINTS.     Take these medicines the morning of surgery with A SIP OF WATER: none   DO NOT TAKE ANY DIABETIC MEDICATIONS DAY OF YOUR SURGERY                                You may not have any metal on your body including hair pins and  piercings  Do not wear jewelry, make-up, lotions, powders or perfumes, deodorant             Do not wear nail polish on your fingernails.  Do not shave  48 hours prior to surgery.              Men may shave face and neck.   Do not bring valuables to the hospital. [place].  Contacts, dentures or bridgework may not be worn into surgery.  Leave suitcase in the car. After surgery it may be brought to your room.     Patients discharged the day of surgery will not be allowed to drive home. IF YOU ARE HAVING SURGERY AND GOING HOME THE SAME DAY, YOU MUST HAVE AN ADULT TO DRIVE YOU HOME AND BE WITH YOU FOR 24 HOURS. YOU MAY GO HOME BY TAXI OR UBER OR ORTHERWISE, BUT AN ADULT MUST ACCOMPANY YOU HOME AND STAY WITH YOU FOR 24 HOURS.  Name and phone number of your driver:  Special Instructions: N/A              Please read over the following fact sheets you were given: _____________________________________________________________________  [facility] - Preparing for Surgery Before surgery, you can play an important role.  Because skin is not sterile, your skin needs to be as free of germs as possible.  You can reduce the number of germs on your skin by washing with CHG (chlorahexidine gluconate) soap before surgery.  CHG is an antiseptic cleaner which kills germs and bonds with the skin to continue killing germs even after washing. Please DO NOT use if you have an allergy to CHG or antibacterial soaps.  If your skin becomes reddened/irritated stop using the CHG and inform your nurse when you arrive at Short Stay. Do not shave (including legs and underarms) for at least 48 hours prior to the first CHG shower.  You may shave your face/neck. Please follow these instructions carefully:  1.  Shower with CHG Soap the night before surgery and the  morning of  Surgery.  2.  If you choose to wash your hair, wash your hair first as usual with your  normal  shampoo.  3.  After you shampoo, rinse your hair and body thoroughly to remove the  shampoo.                           4.  Use CHG as you would any other liquid soap.  You can apply chg directly  to the skin and wash                       Gently with a scrungie or clean washcloth.  5.  Apply the CHG Soap to your body ONLY FROM THE NECK DOWN.   Do not use on face/ open                           Wound or open sores. Avoid contact with eyes, ears mouth and genitals (private parts).                       Wash face,  Genitals (private parts) with your normal soap.             6.  Wash thoroughly, paying special attention to the area where your surgery  will be performed.  7.  Thoroughly rinse your body with warm water from the neck down.  8.  DO NOT shower/wash with your normal soap after using and rinsing off  the CHG Soap.                9.  Pat yourself dry with a clean towel.            10.  Wear clean pajamas.            11.  Place clean sheets on your bed the night of your first shower and do not  sleep with pets. Day of Surgery : Do not apply any lotions/deodorants the morning of surgery.  Please wear clean clothes to the hospital/surgery center.  FAILURE TO FOLLOW THESE INSTRUCTIONS MAY RESULT IN THE CANCELLATION OF YOUR SURGERY PATIENT SIGNATURE_________________________________  NURSE SIGNATURE__________________________________  ________________________________________________________________________

## 2020-06-30 ENCOUNTER — Encounter (HOSPITAL_COMMUNITY)
Admission: RE | Admit: 2020-06-30 | Discharge: 2020-06-30 | Disposition: A | Discharge status: Home / Self Care | Payer: BC Managed Care – PPO | Source: Ambulatory Visit | Attending: General Surgery | Admitting: General Surgery

## 2020-06-30 ENCOUNTER — Other Ambulatory Visit: Payer: Self-pay

## 2020-06-30 ENCOUNTER — Encounter (HOSPITAL_COMMUNITY): Payer: Self-pay

## 2020-06-30 DIAGNOSIS — Z01812 Encounter for preprocedural laboratory examination: Secondary | ICD-10-CM | POA: Diagnosis present

## 2020-06-30 HISTORY — DX: Pneumonia, unspecified organism: J18.9

## 2020-06-30 HISTORY — DX: Personal history of urinary calculi: Z87.442

## 2020-06-30 LAB — COMPREHENSIVE METABOLIC PANEL
ALT: 15 U/L (ref 0–44)
AST: 16 U/L (ref 15–41)
Albumin: 3.6 g/dL (ref 3.5–5.0)
Alkaline Phosphatase: 52 U/L (ref 38–126)
Anion gap: 7 (ref 5–15)
BUN: 13 mg/dL (ref 6–20)
CO2: 27 mmol/L (ref 22–32)
Calcium: 9.4 mg/dL (ref 8.9–10.3)
Chloride: 106 mmol/L (ref 98–111)
Creatinine, Ser: 0.7 mg/dL (ref 0.44–1.00)
GFR, Estimated: >60 mL/min (ref >60)
Glucose, Bld: 124 mg/dL — ABNORMAL HIGH (ref 70–99)
Potassium: 4.2 mmol/L (ref 3.5–5.1)
Sodium: 140 mmol/L (ref 135–145)
Total Bilirubin: 0.7 mg/dL (ref 0.3–1.2)
Total Protein: 7.2 g/dL (ref 6.5–8.1)

## 2020-06-30 LAB — CBC WITH DIFFERENTIAL/PLATELET
Abs Immature Granulocytes: 0.02 10*3/uL (ref 0.00–0.07)
Basophils Absolute: 0 10*3/uL (ref 0.0–0.1)
Basophils Relative: 1 %
Eosinophils Absolute: 0.2 10*3/uL (ref 0.0–0.5)
Eosinophils Relative: 3 %
HCT: 38.9 % (ref 36.0–46.0)
Hemoglobin: 11.9 g/dL — ABNORMAL LOW (ref 12.0–15.0)
Immature Granulocytes: 0 %
Lymphocytes Relative: 40 %
Lymphs Abs: 3 10*3/uL (ref 0.7–4.0)
MCH: 28.9 pg (ref 26.0–34.0)
MCHC: 30.6 g/dL (ref 30.0–36.0)
MCV: 94.4 fL (ref 80.0–100.0)
Monocytes Absolute: 0.5 10*3/uL (ref 0.1–1.0)
Monocytes Relative: 7 %
Neutro Abs: 3.7 10*3/uL (ref 1.7–7.7)
Neutrophils Relative %: 49 %
Platelets: 233 10*3/uL (ref 150–400)
RBC: 4.12 MIL/uL (ref 3.87–5.11)
RDW: 14.3 % (ref 11.5–15.5)
WBC: 7.5 10*3/uL (ref 4.0–10.5)
nRBC: 0 % (ref 0.0–0.2)

## 2020-06-30 LAB — HEMOGLOBIN A1C
Hgb A1c MFr Bld: 6.3 % — ABNORMAL HIGH (ref 4.8–5.6)
Mean Plasma Glucose: 134.11 mg/dL

## 2020-06-30 NOTE — Progress Notes (Signed)
Anesthesia Review:  PCP: [facility] 05/15/20 on chart  Cardiologist :none  Chest x-ray : 01/22/2020  EKG :01/22/2020  Echo : Stress test: Cardiac Cath :  Activity level: can climb a fight of stairs iwthout difficulty  Sleep Study/ CPAP :no  Fasting Blood Sugar :      / Checks Blood Sugar -- times a day:   Blood Thinner/ Instructions /Last Dose: ASA / Instructions/ Last Dose :  Checks glucose 2 x daily  A1c-06/30/20-

## 2020-07-03 ENCOUNTER — Other Ambulatory Visit (HOSPITAL_COMMUNITY): Payer: Medicaid Other

## 2020-07-06 MED ORDER — BUPIVACAINE LIPOSOME 1.3 % IJ SUSP
20.0000 mL | Freq: Once | INTRAMUSCULAR | Status: DC
  Filled 2020-07-06: qty 20

## 2020-07-06 NOTE — Anesthesia Preprocedure Evaluation (Addendum)
Anesthesia Evaluation  Patient identified by MRN, date of birth, ID band Patient awake    Reviewed: Allergy & Precautions, NPO status , Patient's Chart, lab work & pertinent test results  History of Anesthesia Complications Negative for: history of anesthetic complications  Airway Mallampati: II  TM Distance: >3 FB Neck ROM: Full    Dental  (+) Missing,    Pulmonary asthma ,    Pulmonary exam normal        Cardiovascular hypertension, Pt. on medications Normal cardiovascular exam     Neuro/Psych negative neurological ROS  negative psych ROS   GI/Hepatic Neg liver ROS, GERD  Controlled,  Endo/Other  diabetes, Type 2, Oral Hypoglycemic AgentsMorbid obesity  Renal/GU negative Renal ROS  negative genitourinary   Musculoskeletal negative musculoskeletal ROS (+)   Abdominal   Peds  Hematology negative hematology ROS (+)   Anesthesia Other Findings Day of surgery medications reviewed with patient.  Reproductive/Obstetrics negative OB ROS                            Anesthesia Physical Anesthesia Plan  ASA: III  Anesthesia Plan: General   Post-op Pain Management:    Induction: Intravenous  PONV Risk Score and Plan: 4 or greater and Treatment may vary due to age or medical condition, Ondansetron, Dexamethasone, Midazolam, Scopolamine patch - Pre-op and Aprepitant  Airway Management Planned: Oral ETT  Additional Equipment: None  Intra-op Plan:   Post-operative Plan: Extubation in OR  Informed Consent: I have reviewed the patients History and Physical, chart, labs and discussed the procedure including the risks, benefits and alternatives for the proposed anesthesia with the patient or authorized representative who has indicated his/her understanding and acceptance.     Dental advisory given  Plan Discussed with: CRNA  Anesthesia Plan Comments:        Anesthesia Quick  Evaluation

## 2020-07-07 ENCOUNTER — Encounter (HOSPITAL_COMMUNITY)
Admission: RE | Disposition: A | Discharge status: Home / Self Care | Payer: Self-pay | Source: Home / Self Care | Attending: General Surgery

## 2020-07-07 ENCOUNTER — Inpatient Hospital Stay (HOSPITAL_COMMUNITY): Payer: BC Managed Care – PPO | Admitting: Anesthesiology

## 2020-07-07 ENCOUNTER — Other Ambulatory Visit: Payer: Self-pay

## 2020-07-07 ENCOUNTER — Inpatient Hospital Stay (HOSPITAL_COMMUNITY): Payer: BC Managed Care – PPO | Admitting: Physician Assistant

## 2020-07-07 ENCOUNTER — Encounter (HOSPITAL_COMMUNITY): Payer: Self-pay | Admitting: General Surgery

## 2020-07-07 ENCOUNTER — Inpatient Hospital Stay (HOSPITAL_COMMUNITY)
Admission: RE | Admit: 2020-07-07 | Discharge: 2020-07-09 | DRG: 621 | Disposition: A | Discharge status: Home / Self Care | Payer: BC Managed Care – PPO | Attending: General Surgery | Admitting: General Surgery

## 2020-07-07 DIAGNOSIS — Z833 Family history of diabetes mellitus: Secondary | ICD-10-CM

## 2020-07-07 DIAGNOSIS — N926 Irregular menstruation, unspecified: Secondary | ICD-10-CM | POA: Diagnosis present

## 2020-07-07 DIAGNOSIS — N200 Calculus of kidney: Secondary | ICD-10-CM | POA: Diagnosis present

## 2020-07-07 DIAGNOSIS — E7800 Pure hypercholesterolemia, unspecified: Secondary | ICD-10-CM | POA: Diagnosis present

## 2020-07-07 DIAGNOSIS — Z87892 Personal history of anaphylaxis: Secondary | ICD-10-CM

## 2020-07-07 DIAGNOSIS — I10 Essential (primary) hypertension: Secondary | ICD-10-CM

## 2020-07-07 DIAGNOSIS — J45909 Unspecified asthma, uncomplicated: Secondary | ICD-10-CM | POA: Diagnosis present

## 2020-07-07 DIAGNOSIS — G8929 Other chronic pain: Secondary | ICD-10-CM | POA: Diagnosis present

## 2020-07-07 DIAGNOSIS — G43909 Migraine, unspecified, not intractable, without status migrainosus: Secondary | ICD-10-CM | POA: Diagnosis present

## 2020-07-07 DIAGNOSIS — Z6841 Body Mass Index (BMI) 40.0 and over, adult: Secondary | ICD-10-CM | POA: Diagnosis not present

## 2020-07-07 DIAGNOSIS — Z8249 Family history of ischemic heart disease and other diseases of the circulatory system: Secondary | ICD-10-CM

## 2020-07-07 DIAGNOSIS — Z91048 Other nonmedicinal substance allergy status: Secondary | ICD-10-CM | POA: Diagnosis not present

## 2020-07-07 DIAGNOSIS — Z91040 Latex allergy status: Secondary | ICD-10-CM

## 2020-07-07 DIAGNOSIS — Z885 Allergy status to narcotic agent status: Secondary | ICD-10-CM | POA: Diagnosis not present

## 2020-07-07 DIAGNOSIS — E7849 Other hyperlipidemia: Secondary | ICD-10-CM | POA: Diagnosis present

## 2020-07-07 DIAGNOSIS — E119 Type 2 diabetes mellitus without complications: Secondary | ICD-10-CM | POA: Diagnosis present

## 2020-07-07 DIAGNOSIS — K219 Gastro-esophageal reflux disease without esophagitis: Secondary | ICD-10-CM | POA: Diagnosis present

## 2020-07-07 DIAGNOSIS — M25561 Pain in right knee: Secondary | ICD-10-CM | POA: Diagnosis present

## 2020-07-07 DIAGNOSIS — F419 Anxiety disorder, unspecified: Secondary | ICD-10-CM | POA: Diagnosis present

## 2020-07-07 DIAGNOSIS — Z9884 Bariatric surgery status: Secondary | ICD-10-CM

## 2020-07-07 DIAGNOSIS — K5909 Other constipation: Secondary | ICD-10-CM | POA: Diagnosis present

## 2020-07-07 DIAGNOSIS — Z8616 Personal history of COVID-19: Secondary | ICD-10-CM | POA: Diagnosis not present

## 2020-07-07 DIAGNOSIS — M25572 Pain in left ankle and joints of left foot: Secondary | ICD-10-CM | POA: Diagnosis present

## 2020-07-07 HISTORY — PX: GASTRIC ROUX-EN-Y: SHX5262

## 2020-07-07 HISTORY — PX: UPPER GI ENDOSCOPY: SHX6162

## 2020-07-07 LAB — GLUCOSE, CAPILLARY
Glucose-Capillary: 139 mg/dL — ABNORMAL HIGH (ref 70–99)
Glucose-Capillary: 174 mg/dL — ABNORMAL HIGH (ref 70–99)
Glucose-Capillary: 166 mg/dL — ABNORMAL HIGH (ref 70–99)

## 2020-07-07 LAB — HEMOGLOBIN AND HEMATOCRIT, BLOOD
HCT: 42.1 % (ref 36.0–46.0)
Hemoglobin: 12.9 g/dL (ref 12.0–15.0)

## 2020-07-07 LAB — ABO/RH: ABO/RH(D): O POS

## 2020-07-07 LAB — TYPE AND SCREEN
ABO/RH(D): O POS
Antibody Screen: NEGATIVE

## 2020-07-07 LAB — PREGNANCY, URINE: Preg Test, Ur: NEGATIVE

## 2020-07-07 SURGERY — LAPAROSCOPIC ROUX-EN-Y GASTRIC BYPASS WITH UPPER ENDOSCOPY
Anesthesia: General

## 2020-07-07 MED ORDER — HYDRALAZINE HCL 20 MG/ML IJ SOLN: 10.0000 mg | INTRAMUSCULAR | Status: DC | PRN

## 2020-07-07 MED ORDER — OXYCODONE HCL 5 MG/5ML PO SOLN
5.0000 mg | Freq: Four times a day (QID) | ORAL | Status: DC | PRN
  Administered 2020-07-07 – 2020-07-09 (×5): 5 mg via ORAL
  Filled 2020-07-07 (×5): qty 5

## 2020-07-07 MED ORDER — FENTANYL CITRATE (PF) 100 MCG/2ML IJ SOLN
INTRAMUSCULAR | Status: AC
  Filled 2020-07-07: qty 2

## 2020-07-07 MED ORDER — SODIUM CHLORIDE 0.9 % IV SOLN
2.0000 g | INTRAVENOUS | Status: AC
  Administered 2020-07-07: 2 g via INTRAVENOUS
  Filled 2020-07-07: qty 2

## 2020-07-07 MED ORDER — PROMETHAZINE HCL 25 MG/ML IJ SOLN: 6.2500 mg | INTRAMUSCULAR | Status: DC | PRN

## 2020-07-07 MED ORDER — PANTOPRAZOLE SODIUM 40 MG IV SOLR
40.0000 mg | Freq: Every day | INTRAVENOUS | Status: DC
  Administered 2020-07-07 – 2020-07-08 (×2): 40 mg via INTRAVENOUS
  Filled 2020-07-07 (×2): qty 40

## 2020-07-07 MED ORDER — FENTANYL CITRATE (PF) 100 MCG/2ML IJ SOLN
INTRAMUSCULAR | Status: DC | PRN
  Administered 2020-07-07: 100 ug via INTRAVENOUS

## 2020-07-07 MED ORDER — SUCCINYLCHOLINE CHLORIDE 200 MG/10ML IV SOSY
PREFILLED_SYRINGE | INTRAVENOUS | Status: AC
  Filled 2020-07-07: qty 10

## 2020-07-07 MED ORDER — SODIUM CHLORIDE (PF) 0.9 % IJ SOLN
INTRAMUSCULAR | Status: DC | PRN
  Administered 2020-07-07: 50 mL

## 2020-07-07 MED ORDER — ACETAMINOPHEN 500 MG PO TABS
1000.0000 mg | ORAL_TABLET | Freq: Three times a day (TID) | ORAL | Status: DC
  Administered 2020-07-07 – 2020-07-09 (×4): 1000 mg via ORAL
  Filled 2020-07-07 (×4): qty 2

## 2020-07-07 MED ORDER — TRAZODONE HCL 50 MG PO TABS: 50.0000 mg | ORAL_TABLET | Freq: Every evening | ORAL | Status: DC | PRN

## 2020-07-07 MED ORDER — APREPITANT 40 MG PO CAPS
40.0000 mg | ORAL_CAPSULE | ORAL | Status: AC
  Administered 2020-07-07: 40 mg via ORAL
  Filled 2020-07-07: qty 1

## 2020-07-07 MED ORDER — "VISTASEAL 4 ML SINGLE DOSE KIT "
4.0000 mL | PACK | Freq: Once | CUTANEOUS | Status: AC
  Administered 2020-07-07: 4 mL via TOPICAL
  Filled 2020-07-07: qty 4

## 2020-07-07 MED ORDER — PROPOFOL 10 MG/ML IV BOLUS
INTRAVENOUS | Status: DC | PRN
Start: 2020-07-07 — End: 2020-07-07
  Administered 2020-07-07: 200 mg via INTRAVENOUS

## 2020-07-07 MED ORDER — MIDAZOLAM HCL 2 MG/2ML IJ SOLN
INTRAMUSCULAR | Status: DC | PRN
  Administered 2020-07-07: 2 mg via INTRAVENOUS

## 2020-07-07 MED ORDER — LACTATED RINGERS IV SOLN: INTRAVENOUS | Status: DC

## 2020-07-07 MED ORDER — DEXAMETHASONE SODIUM PHOSPHATE 10 MG/ML IJ SOLN
INTRAMUSCULAR | Status: AC
  Filled 2020-07-07: qty 1

## 2020-07-07 MED ORDER — CHLORHEXIDINE GLUCONATE 4 % EX LIQD: 60.0000 mL | Freq: Once | CUTANEOUS | Status: DC

## 2020-07-07 MED ORDER — SCOPOLAMINE 1 MG/3DAYS TD PT72
1.0000 | MEDICATED_PATCH | TRANSDERMAL | Status: DC
  Administered 2020-07-07: 1.5 mg via TRANSDERMAL
  Filled 2020-07-07: qty 1

## 2020-07-07 MED ORDER — INSULIN ASPART 100 UNIT/ML ~~LOC~~ SOLN
0.0000 [IU] | SUBCUTANEOUS | Status: DC
  Administered 2020-07-07 – 2020-07-08 (×2): 4 [IU] via SUBCUTANEOUS
  Administered 2020-07-08 – 2020-07-09 (×3): 3 [IU] via SUBCUTANEOUS

## 2020-07-07 MED ORDER — LISINOPRIL 5 MG PO TABS
2.5000 mg | ORAL_TABLET | Freq: Every day | ORAL | Status: DC
  Administered 2020-07-08 – 2020-07-09 (×2): 2.5 mg via ORAL
  Filled 2020-07-07 (×2): qty 1

## 2020-07-07 MED ORDER — STERILE WATER FOR IRRIGATION IR SOLN
Status: DC | PRN
  Administered 2020-07-07 (×2): 1000 mL

## 2020-07-07 MED ORDER — LIDOCAINE 2% (20 MG/ML) 5 ML SYRINGE
INTRAMUSCULAR | Status: DC | PRN
  Administered 2020-07-07: 60 mg via INTRAVENOUS

## 2020-07-07 MED ORDER — SCOPOLAMINE 1 MG/3DAYS TD PT72: 1.0000 | MEDICATED_PATCH | Freq: Once | TRANSDERMAL | Status: DC

## 2020-07-07 MED ORDER — MIDAZOLAM HCL 2 MG/2ML IJ SOLN
INTRAMUSCULAR | Status: AC
  Filled 2020-07-07: qty 2

## 2020-07-07 MED ORDER — SUGAMMADEX SODIUM 200 MG/2ML IV SOLN
INTRAVENOUS | Status: DC | PRN
  Administered 2020-07-07: 280 mg via INTRAVENOUS

## 2020-07-07 MED ORDER — ACETAMINOPHEN 10 MG/ML IV SOLN: 1000.0000 mg | Freq: Once | INTRAVENOUS | Status: DC | PRN

## 2020-07-07 MED ORDER — GABAPENTIN 100 MG PO CAPS
200.0000 mg | ORAL_CAPSULE | Freq: Two times a day (BID) | ORAL | Status: DC
  Administered 2020-07-07 – 2020-07-09 (×4): 200 mg via ORAL
  Filled 2020-07-07 (×4): qty 2

## 2020-07-07 MED ORDER — LIDOCAINE 2% (20 MG/ML) 5 ML SYRINGE
INTRAMUSCULAR | Status: AC
  Filled 2020-07-07: qty 5

## 2020-07-07 MED ORDER — ACETAMINOPHEN 160 MG/5ML PO SOLN
1000.0000 mg | Freq: Three times a day (TID) | ORAL | Status: DC
  Administered 2020-07-08 – 2020-07-09 (×2): 1000 mg via ORAL
  Filled 2020-07-07 (×2): qty 40.6

## 2020-07-07 MED ORDER — PHENYLEPHRINE HCL-NACL 20-0.9 MG/250ML-% IV SOLN
INTRAVENOUS | Status: DC | PRN
  Administered 2020-07-07: 30 ug/min via INTRAVENOUS

## 2020-07-07 MED ORDER — ONDANSETRON HCL 4 MG/2ML IJ SOLN
INTRAMUSCULAR | Status: AC
  Filled 2020-07-07: qty 2

## 2020-07-07 MED ORDER — POTASSIUM CHLORIDE IN NACL 20-0.45 MEQ/L-% IV SOLN
INTRAVENOUS | Status: DC
  Filled 2020-07-07 (×5): qty 1000

## 2020-07-07 MED ORDER — ONDANSETRON HCL 4 MG/2ML IJ SOLN
INTRAMUSCULAR | Status: DC | PRN
  Administered 2020-07-07: 4 mg via INTRAVENOUS

## 2020-07-07 MED ORDER — FIBRIN SEALANT 2 ML SINGLE DOSE KIT
2.0000 mL | PACK | Freq: Once | CUTANEOUS | Status: AC
  Administered 2020-07-07: 2 mL via TOPICAL
  Filled 2020-07-07: qty 2

## 2020-07-07 MED ORDER — ENSURE MAX PROTEIN PO LIQD
2.0000 [oz_av] | ORAL | Status: DC
  Administered 2020-07-08 – 2020-07-09 (×10): 2 [oz_av] via ORAL

## 2020-07-07 MED ORDER — ACETAMINOPHEN 500 MG PO TABS
1000.0000 mg | ORAL_TABLET | ORAL | Status: AC
  Administered 2020-07-07: 1000 mg via ORAL
  Filled 2020-07-07: qty 2

## 2020-07-07 MED ORDER — SODIUM CHLORIDE 0.9 % IV SOLN
12.5000 mg | Freq: Four times a day (QID) | INTRAVENOUS | Status: DC | PRN
  Filled 2020-07-07: qty 0.5

## 2020-07-07 MED ORDER — DEXAMETHASONE SODIUM PHOSPHATE 10 MG/ML IJ SOLN
INTRAMUSCULAR | Status: DC | PRN
  Administered 2020-07-07: 8 mg via INTRAVENOUS

## 2020-07-07 MED ORDER — HYPROMELLOSE (GONIOSCOPIC) 2.5 % OP SOLN: 1.0000 [drp] | Freq: Three times a day (TID) | OPHTHALMIC | Status: DC | PRN

## 2020-07-07 MED ORDER — SUGAMMADEX SODIUM 500 MG/5ML IV SOLN
INTRAVENOUS | Status: AC
  Filled 2020-07-07: qty 5

## 2020-07-07 MED ORDER — BUPIVACAINE LIPOSOME 1.3 % IJ SUSP
INTRAMUSCULAR | Status: DC | PRN
  Administered 2020-07-07: 20 mL

## 2020-07-07 MED ORDER — HEPARIN SODIUM (PORCINE) 5000 UNIT/ML IJ SOLN
5000.0000 [IU] | INTRAMUSCULAR | Status: AC
  Administered 2020-07-07: 5000 [IU] via SUBCUTANEOUS
  Filled 2020-07-07: qty 1

## 2020-07-07 MED ORDER — GABAPENTIN 300 MG PO CAPS
300.0000 mg | ORAL_CAPSULE | ORAL | Status: AC
  Administered 2020-07-07: 300 mg via ORAL
  Filled 2020-07-07: qty 1

## 2020-07-07 MED ORDER — PHENYLEPHRINE 40 MCG/ML (10ML) SYRINGE FOR IV PUSH (FOR BLOOD PRESSURE SUPPORT)
PREFILLED_SYRINGE | INTRAVENOUS | Status: AC
  Filled 2020-07-07: qty 10

## 2020-07-07 MED ORDER — MORPHINE SULFATE (PF) 2 MG/ML IV SOLN
1.0000 mg | INTRAVENOUS | Status: DC | PRN
  Administered 2020-07-07: 1 mg via INTRAVENOUS
  Filled 2020-07-07: qty 1

## 2020-07-07 MED ORDER — PROPOFOL 10 MG/ML IV BOLUS
INTRAVENOUS | Status: AC
  Filled 2020-07-07: qty 20

## 2020-07-07 MED ORDER — ROCURONIUM BROMIDE 10 MG/ML (PF) SYRINGE
PREFILLED_SYRINGE | INTRAVENOUS | Status: AC
  Filled 2020-07-07: qty 10

## 2020-07-07 MED ORDER — LIDOCAINE 2% (20 MG/ML) 5 ML SYRINGE
INTRAMUSCULAR | Status: DC | PRN
  Administered 2020-07-07: 1.5 mg/kg/h via INTRAVENOUS

## 2020-07-07 MED ORDER — SIMETHICONE 80 MG PO CHEW
80.0000 mg | CHEWABLE_TABLET | Freq: Four times a day (QID) | ORAL | Status: DC | PRN
  Administered 2020-07-09: 80 mg via ORAL
  Filled 2020-07-07: qty 1

## 2020-07-07 MED ORDER — LIDOCAINE HCL 2 % IJ SOLN
INTRAMUSCULAR | Status: AC
  Filled 2020-07-07: qty 20

## 2020-07-07 MED ORDER — FENTANYL CITRATE (PF) 100 MCG/2ML IJ SOLN
25.0000 ug | INTRAMUSCULAR | Status: DC | PRN
  Administered 2020-07-07: 50 ug via INTRAVENOUS

## 2020-07-07 MED ORDER — 0.9 % SODIUM CHLORIDE (POUR BTL) OPTIME
TOPICAL | Status: DC | PRN
  Administered 2020-07-07: 1000 mL

## 2020-07-07 MED ORDER — PHENYLEPHRINE 40 MCG/ML (10ML) SYRINGE FOR IV PUSH (FOR BLOOD PRESSURE SUPPORT)
PREFILLED_SYRINGE | INTRAVENOUS | Status: DC | PRN
  Administered 2020-07-07: 40 ug via INTRAVENOUS
  Administered 2020-07-07 (×2): 120 ug via INTRAVENOUS
  Administered 2020-07-07: 80 ug via INTRAVENOUS

## 2020-07-07 MED ORDER — APREPITANT 40 MG PO CAPS: 40.0000 mg | ORAL_CAPSULE | Freq: Once | ORAL | Status: DC

## 2020-07-07 MED ORDER — PHENYLEPHRINE HCL (PRESSORS) 10 MG/ML IV SOLN
INTRAVENOUS | Status: AC
  Filled 2020-07-07: qty 1

## 2020-07-07 MED ORDER — SUCCINYLCHOLINE CHLORIDE 200 MG/10ML IV SOSY
PREFILLED_SYRINGE | INTRAVENOUS | Status: DC | PRN
  Administered 2020-07-07: 140 mg via INTRAVENOUS

## 2020-07-07 MED ORDER — DIPHENHYDRAMINE HCL 50 MG/ML IJ SOLN: 12.5000 mg | Freq: Three times a day (TID) | INTRAMUSCULAR | Status: DC | PRN

## 2020-07-07 MED ORDER — KETAMINE HCL 10 MG/ML IJ SOLN
INTRAMUSCULAR | Status: DC | PRN
  Administered 2020-07-07: 15 mg via INTRAVENOUS
  Administered 2020-07-07: 10 mg via INTRAVENOUS

## 2020-07-07 MED ORDER — ROCURONIUM BROMIDE 10 MG/ML (PF) SYRINGE
PREFILLED_SYRINGE | INTRAVENOUS | Status: DC | PRN
  Administered 2020-07-07: 30 mg via INTRAVENOUS
  Administered 2020-07-07: 20 mg via INTRAVENOUS
  Administered 2020-07-07: 50 mg via INTRAVENOUS
  Administered 2020-07-07: 10 mg via INTRAVENOUS

## 2020-07-07 MED ORDER — POLYVINYL ALCOHOL 1.4 % OP SOLN
1.0000 [drp] | Freq: Three times a day (TID) | OPHTHALMIC | Status: DC | PRN
  Filled 2020-07-07: qty 15

## 2020-07-07 MED ORDER — LACTATED RINGERS IR SOLN
Status: DC | PRN
  Administered 2020-07-07: 3000 mL

## 2020-07-07 MED ORDER — ONDANSETRON HCL 4 MG/2ML IJ SOLN
4.0000 mg | Freq: Four times a day (QID) | INTRAMUSCULAR | Status: DC | PRN
  Administered 2020-07-08: 4 mg via INTRAVENOUS
  Filled 2020-07-07: qty 2

## 2020-07-07 MED ORDER — ENOXAPARIN SODIUM 30 MG/0.3ML ~~LOC~~ SOLN
30.0000 mg | Freq: Two times a day (BID) | SUBCUTANEOUS | Status: DC
  Administered 2020-07-07 – 2020-07-09 (×4): 30 mg via SUBCUTANEOUS
  Filled 2020-07-07 (×4): qty 0.3

## 2020-07-07 MED ORDER — DEXAMETHASONE SODIUM PHOSPHATE 4 MG/ML IJ SOLN: 4.0000 mg | INTRAMUSCULAR | Status: DC

## 2020-07-07 SURGICAL SUPPLY — 70 items
APPLICATOR COTTON TIP 6 STRL (MISCELLANEOUS) IMPLANT
APPLICATOR COTTON TIP 6IN STRL (MISCELLANEOUS)
APPLICATOR VISTASEAL 35 (MISCELLANEOUS) ×4 IMPLANT
APPLIER CLIP ROT 13.4 12 LRG (CLIP)
BLADE SURG SZ11 CARB STEEL (BLADE) ×2 IMPLANT
BNDG ADH 1X3 SHEER STRL LF (GAUZE/BANDAGES/DRESSINGS) IMPLANT
CABLE HIGH FREQUENCY MONO STRZ (ELECTRODE) IMPLANT
CHLORAPREP W/TINT 26 (MISCELLANEOUS) ×4 IMPLANT
CLIP APPLIE ROT 13.4 12 LRG (CLIP) IMPLANT
CLIP SUT LAPRA TY ABSORB (SUTURE) ×4 IMPLANT
CLSR STERI-STRIP ANTIMIC 1/2X4 (GAUZE/BANDAGES/DRESSINGS) ×2 IMPLANT
COVER WAND RF STERILE (DRAPES) IMPLANT
CUTTER FLEX LINEAR 45M (STAPLE) IMPLANT
DEVICE SUT QUICK LOAD TK 5 (STAPLE) IMPLANT
DEVICE SUT TI-KNOT TK 5X26 (MISCELLANEOUS) IMPLANT
DEVICE SUTURE ENDOST 10MM (ENDOMECHANICALS) ×2 IMPLANT
DRAIN PENROSE 0.25X18 (DRAIN) ×2 IMPLANT
DRSG TEGADERM 2-3/8X2-3/4 SM (GAUZE/BANDAGES/DRESSINGS) ×2 IMPLANT
ELECT REM PT RETURN 15FT ADLT (MISCELLANEOUS) ×2 IMPLANT
GAUZE 4X4 16PLY RFD (DISPOSABLE) ×2 IMPLANT
GAUZE SPONGE 2X2 8PLY STRL LF (GAUZE/BANDAGES/DRESSINGS) ×1 IMPLANT
GAUZE SPONGE 4X4 12PLY STRL (GAUZE/BANDAGES/DRESSINGS) IMPLANT
GLOVE SURG ENC MOIS LTX SZ7.5 (GLOVE) IMPLANT
GLOVE SURG UNDER LTX SZ8 (GLOVE) ×2 IMPLANT
GOWN STRL REUS W/TWL XL LVL3 (GOWN DISPOSABLE) ×8 IMPLANT
KIT BASIN OR (CUSTOM PROCEDURE TRAY) ×2 IMPLANT
KIT GASTRIC LAVAGE 34FR ADT (SET/KITS/TRAYS/PACK) ×2 IMPLANT
KIT TURNOVER KIT A (KITS) ×2 IMPLANT
MARKER SKIN DUAL TIP RULER LAB (MISCELLANEOUS) ×2 IMPLANT
MAT PREVALON FULL STRYKER (MISCELLANEOUS) ×2 IMPLANT
NEEDLE SPNL 22GX3.5 QUINCKE BK (NEEDLE) ×2 IMPLANT
PACK CARDIOVASCULAR III (CUSTOM PROCEDURE TRAY) ×2 IMPLANT
PENCIL SMOKE EVACUATOR (MISCELLANEOUS) IMPLANT
RELOAD 45 VASCULAR/THIN (ENDOMECHANICALS) IMPLANT
RELOAD ENDO STITCH 2.0 (ENDOMECHANICALS) ×10
RELOAD STAPLE TA45 3.5 REG BLU (ENDOMECHANICALS) IMPLANT
RELOAD STAPLER BLUE 60MM (STAPLE) ×3 IMPLANT
RELOAD STAPLER GOLD 60MM (STAPLE) ×1 IMPLANT
RELOAD STAPLER WHITE 60MM (STAPLE) ×2 IMPLANT
SCISSORS LAP 5X45 EPIX DISP (ENDOMECHANICALS) ×2 IMPLANT
SET IRRIG TUBING LAPAROSCOPIC (IRRIGATION / IRRIGATOR) ×2 IMPLANT
SET TUBE SMOKE EVAC HIGH FLOW (TUBING) ×2 IMPLANT
SHEARS HARMONIC ACE PLUS 45CM (MISCELLANEOUS) ×2 IMPLANT
SLEEVE XCEL OPT CAN 5 100 (ENDOMECHANICALS) ×2 IMPLANT
SOL ANTI FOG 6CC (MISCELLANEOUS) ×1 IMPLANT
SOLUTION ANTI FOG 6CC (MISCELLANEOUS) ×1
SPONGE GAUZE 2X2 STER 10/PKG (GAUZE/BANDAGES/DRESSINGS) ×1
STAPLER ECHELON BIOABSB 60 FLE (MISCELLANEOUS) IMPLANT
STAPLER ECHELON LONG 60 440 (INSTRUMENTS) ×2 IMPLANT
STAPLER RELOAD BLUE 60MM (STAPLE) ×6
STAPLER RELOAD GOLD 60MM (STAPLE) ×2
STAPLER RELOAD WHITE 60MM (STAPLE) ×4
STRIP CLOSURE SKIN 1/2X4 (GAUZE/BANDAGES/DRESSINGS) ×12 IMPLANT
SURGILUBE 2OZ TUBE FLIPTOP (MISCELLANEOUS) ×2 IMPLANT
SUT MNCRL AB 4-0 PS2 18 (SUTURE) ×2 IMPLANT
SUT RELOAD ENDO STITCH 2 48X1 (ENDOMECHANICALS) ×5
SUT RELOAD ENDO STITCH 2.0 (ENDOMECHANICALS) ×5
SUT SURGIDAC NAB ES-9 0 48 120 (SUTURE) IMPLANT
SUT VIC AB 2-0 SH 27 (SUTURE) ×1
SUT VIC AB 2-0 SH 27X BRD (SUTURE) ×1 IMPLANT
SUTURE RELOAD END STTCH 2 48X1 (ENDOMECHANICALS) ×5 IMPLANT
SUTURE RELOAD ENDO STITCH 2.0 (ENDOMECHANICALS) ×5 IMPLANT
SYR 20ML LL LF (SYRINGE) ×4 IMPLANT
TOWEL OR 17X26 10 PK STRL BLUE (TOWEL DISPOSABLE) ×2 IMPLANT
TOWEL OR NON WOVEN STRL DISP B (DISPOSABLE) ×2 IMPLANT
TRAY FOLEY MTR SLVR 16FR STAT (SET/KITS/TRAYS/PACK) IMPLANT
TROCAR BLADELESS OPT 5 100 (ENDOMECHANICALS) ×2 IMPLANT
TROCAR UNIVERSAL OPT 12M 100M (ENDOMECHANICALS) ×6 IMPLANT
TROCAR XCEL 12X100 BLDLESS (ENDOMECHANICALS) ×2 IMPLANT
TUBING CONNECTING 10 (TUBING) ×4 IMPLANT

## 2020-07-07 NOTE — Op Note (Signed)
Gloria Kim [id number] 10/06/1975. 07/07/2020  Preoperative diagnosis:  Active Problems:   Diabetes mellitus with coincident hypertension (HCC)   Other hyperlipidemia   Morbid obesity (BMI 55)   Migraine headache   Asthma   GERD (gastroesophageal reflux disease)   Chronic pain of right knee  Postoperative  diagnosis:  1. same  Surgical procedure: Laparoscopic Roux-en-Y gastric bypass (ante-colic, ante-gastric); upper endoscopy  Surgeon: Gloria Kim, M.D. FACS  Asst.: Phylliss Blakes MD FACS  Anesthesia: General plus exparel/marcaine mix  Complications: None   EBL: Minimal   Drains: None   Disposition: PACU in good condition   Indications for procedure: 45 y.o. yo female with morbid obesity who has been unsuccessful at sustained weight loss. The patient's comorbidities are listed above. We discussed the risk and benefits of surgery including but not limited to anesthesia risk, bleeding, infection, blood clot formation, anastomotic leak, anastomotic stricture, ulcer formation, death, respiratory complications, intestinal blockage, internal hernia, gallstone formation, vitamin and nutritional deficiencies, injury to surrounding structures, failure to lose weight and mood changes.   Description of procedure: Patient is brought to the operating room and general anesthesia induced. The patient had received preoperative broad-spectrum IV antibiotics and subcutaneous heparin. The abdomen was widely sterilely prepped with Chloraprep and draped. Patient timeout was performed and correct patient and procedure confirmed. Access was obtained with a 12 mm Optiview trocar in the left upper quadrant and pneumoperitoneum established without difficulty. Under direct vision 12 mm trocars were placed laterally in the right upper quadrant, right upper quadrant midclavicular line, and to the left and above the umbilicus for the camera port. A 5 mm trocar was placed laterally in the left upper quadrant.   The patient had omentum adhesions to her anterior abdominal wall starting in the umbilical region extending to the lower midline.  This was taken down with harmonic scalpel. Exparel/marcaine mix was infiltrated in bilateral lateral abdominal walls as a TAP block.  The omentum was brought into the upper abdomen and the transverse mesocolon elevated and the ligament of Treitz clearly identified. A 40 cm biliopancreatic limb was then carefully measured from the ligament of Treitz. The small intestine was divided at this point with a single firing of the white load linear stapler. A Penrose drain was sutured to the end of the Roux-en-Y limb for later identification. A 100 cm Roux-en-Y limb was then carefully measured. At this point a side-to-side anastomosis was created between the Roux limb and the end of the biliopancreatic limb. This was accomplished with a single firing of the 60 mm white load linear stapler. The common enterotomy was closed with a running 2-0 Vicryl begun at either end of the enterotomy and tied centrally. Vistaseal tissue sealant was placed over the anastomosis. The mesenteric defect was then closed with running 2-0 silk. The omentum was then divided with the harmonic scalpel up towards the transverse colon to allow mobility of the Roux limb toward the gastric pouch. The patient was then placed in steep reversed Trendelenburg. Through a 5 mm subxiphoid site the [facility] retractor was placed and the left lobe of the liver elevated with excellent exposure of the upper stomach and hiatus.  The patient had a very large caudate lobe. the angle of Hiss was then mobilized with the harmonic scalpel. A 5cm gastric pouch was then carefully measured along the lesser curve of the stomach. Dissection was carried along the lesser curve at this point with the Harmonic scalpel working carefully back toward the lesser sac at  right angles to the lesser curve. The free lesser sac was then entered. After being sure  all tubes were removed from the stomach an initial firing of the gold load 60 mm linear stapler was fired at right angles across the lesser curve for about 4 cm. The gastric pouch was further mobilized posteriorly and then the pouch was completed with 3 further firings of the 60 mm blue load linear stapler up through the previously dissected angle of His. It was ensured that the pouch was completely mobilized away from the gastric remnant. This created a nice tubular 5 cm gastric pouch. The Roux limb was then brought up in an antecolic fashion with the candycane facing to the patient's left without undue tension. The gastrojejunostomy was created with an initial posterior row of 2-0 Vicryl between the Roux limb and the staple line of the gastric pouch. Enterotomies were then made in the gastric pouch and the Roux limb with the harmonic scalpel and at approximately 2-2-1/2 cm anastomosis was created with a single firing of the 36mm blue load linear stapler. The staple line was inspected and was intact without bleeding. The common enterotomy was then closed with running 2-0 Vicryl begun at either end and tied centrally. The visigi tube was then easily passed through the anastomosis and an outer anterior layer of running 2-0 Vicryl was placed. The visigi tube was removed. With the outlet of the gastrojejunostomy clamped and under saline irrigation the assistant performed upper endoscopy and with the gastric pouch tensely distended with air-there was no evidence of leak on this test. The pouch was desufflated. The Vonita Moss defect was closed with running 2-0 silk. The abdomen was inspected for any evidence of bleeding or bowel injury and everything looked fine. The Nathanson retractor was removed under direct vision after coating the anastomosis with Eviceal tissue sealant. All CO2 was evacuated and trochars removed. Skin incisions were closed with 4-0 monocryl in a subcuticular fashion followed by  steri-strips and  bandages. Sponge needle and instrument counts were correct. The patient was taken to the PACU in good condition.    [name]. [name] Campanile, MD, FACS General, Bariatric, & Minimally Invasive Surgery [facility], [place]

## 2020-07-07 NOTE — Progress Notes (Signed)
Discussed post op day goals with patient including ambulation, IS, diet progression, pain, and nausea control.  BSTOP education provided including BSTOP information guide, "Guide for Pain Management after your Bariatric Procedure".  Questions answered. 

## 2020-07-07 NOTE — Interval H&P Note (Signed)
History and Physical Interval Note:  07/07/2020 10:21 AM  Gloria Kim  has presented today for surgery, with the diagnosis of MORBID OBESITY.  The various methods of treatment have been discussed with the patient and family. After consideration of risks, benefits and other options for treatment, the patient has consented to  Procedure(s) with comments: LAPAROSCOPIC ROUX-EN-Y GASTRIC BYPASS WITH UPPER ENDOSCOPY (N/A) - 3 HOURS UPPER GI ENDOSCOPY (N/A) as a surgical intervention.  The patient's history has been reviewed, patient examined, no change in status, stable for surgery.  I have reviewed the patient's chart and labs.  Questions were answered to the patient's satisfaction.     Gaynelle Adu

## 2020-07-07 NOTE — Progress Notes (Signed)
PHARMACY CONSULT FOR:  Risk Assessment for Post-Discharge VTE Following Bariatric Surgery  Post-Discharge VTE Risk Assessment: This patient's probability of 30-day post-discharge VTE is increased due to the factors marked:   Female    Age >/=60 years   X BMI >/=50 kg/m2    CHF    Dyspnea at Rest    Paraplegia  X  Non-gastric-band surgery    Operation Time >/=3 hr    Return to OR     Length of Stay >/= 3 d   Hx of VTE   Hypercoagulable condition   Significant venous stasis       Predicted probability of 30-day post-discharge VTE: 0.27%  Other patient-specific factors to consider:   Recommendation for Discharge: No pharmacologic prophylaxis post-discharge  Gloria Kim is a 45 y.o. female who underwent laparoscopic sleeve gastrectomy on 3/22   Case start: 1135 Case end: 1351   Allergies  Allergen Reactions  . Shrimp [Shellfish Allergy] Anaphylaxis  . Nickel Rash  . Tramadol Hives and Nausea Only  . Latex Rash    Patient Measurements: Height: 5\' 2"  (157.5 cm) Weight: (!) 136.5 kg (301 lb) IBW/kg (Calculated) : 50.1 Body mass index is 55.05 kg/m.  Recent Labs    07/07/20 1449  HGB 12.9  HCT 42.1   Estimated Creatinine Clearance: 120 mL/min (by C-G formula based on SCr of 0.7 mg/dL).    Past Medical History:  Diagnosis Date  . Asthma   . Constipation   . DM (diabetes mellitus) (HCC)    type 2   . Dyslipidemia   . GERD (gastroesophageal reflux disease)   . History of kidney stones   . Hypertension   . Migraines    hx of not recent   . Obesity   . Pneumonia    hx of as a baby   . Right knee pain      Medications Prior to Admission  Medication Sig Dispense Refill Last Dose  . atorvastatin (LIPITOR) 10 MG tablet Take 10 mg by mouth daily.   07/06/2020 at Unknown time  . lisinopril (ZESTRIL) 2.5 MG tablet Take 2.5 mg by mouth daily.   07/06/2020 at Unknown time  . metFORMIN (GLUCOPHAGE-XR) 500 MG 24 hr tablet Take 1,000 mg by mouth in the morning  and at bedtime.   07/06/2020 at Unknown time  . acetaminophen (TYLENOL) 325 MG tablet Take 975 mg by mouth every 6 (six) hours as needed for mild pain or moderate pain. (Patient not taking: Reported on 06/19/2020)   Not Taking at Unknown time  . albuterol (PROVENTIL HFA;VENTOLIN HFA) 108 (90 BASE) MCG/ACT inhaler Inhale 2 puffs into the lungs every 6 (six) hours as needed for wheezing or shortness of breath. (Patient not taking: Reported on 06/19/2020) 1 Inhaler 0 Not Taking at Unknown time  . albuterol (PROVENTIL) (2.5 MG/3ML) 0.083% nebulizer solution Take 3 mLs (2.5 mg total) by nebulization every 6 (six) hours as needed for wheezing or shortness of breath. (Patient not taking: Reported on 06/19/2020) 75 mL 12 Not Taking at Unknown time  . hydroxypropyl methylcellulose / hypromellose (ISOPTO TEARS / GONIOVISC) 2.5 % ophthalmic solution Place 1 drop into both eyes 3 (three) times daily as needed for dry eyes.   More than a month at Unknown time  . naproxen (NAPROSYN) 500 MG tablet Take 1 tablet (500 mg total) by mouth 2 (two) times daily as needed for mild pain, moderate pain or headache (TAKE WITH MEALS.). (Patient not taking: No sig reported) 20 tablet  0 Not Taking at Unknown time  . oxyCODONE-acetaminophen (PERCOCET) 5-325 MG per tablet Take 1 tablet by mouth every 6 (six) hours as needed for severe pain. (Patient not taking: No sig reported) 10 tablet 0 Not Taking at Unknown time  . traZODone (DESYREL) 50 MG tablet Take 50 mg by mouth at bedtime as needed for sleep.   More than a month at Unknown time  . triamcinolone ointment (KENALOG) 0.1 % Apply 1 application topically daily as needed (eczema).   More than a month at Unknown time       Gloria Kim 07/07/2020,4:10 PM

## 2020-07-07 NOTE — Transfer of Care (Signed)
Immediate Anesthesia Transfer of Care Note  Patient: Gloria Kim  Procedure(s) Performed: LAPAROSCOPIC ROUX-EN-Y GASTRIC BYPASS WITH UPPER ENDOSCOPY (N/A ) UPPER GI ENDOSCOPY (N/A )  Patient Location: PACU  Anesthesia Type:General  Level of Consciousness: awake  Airway & Oxygen Therapy: Patient Spontanous Breathing and Patient connected to face mask oxygen  Post-op Assessment: Report given to RN, Post -op Vital signs reviewed and stable and Patient moving all extremities X 4  Post vital signs: Reviewed and stable  Last Vitals:  Vitals Value Taken Time  BP    Temp    Pulse    Resp 21 07/07/20 1404  SpO2    Vitals shown include unvalidated device data.  Last Pain:  Vitals:   07/07/20 0922  TempSrc: Oral  PainSc: 0-No pain         Complications: No complications documented.

## 2020-07-07 NOTE — Op Note (Signed)
Preoperative diagnosis: laparoscopic roux en y gastric bypass  Postoperative diagnosis: Same   Procedure: Upper endoscopy   Surgeon: Berna Bue, M.D.  Anesthesia: Gen.   Description of procedure: The endoscope was placed in the mouth and oropharynx and under endoscopic vision it was advanced to the esophagogastric junction which was identified at 38cm from the teeth.  The pouch was tensely insufflated while Dr. [name] Campanile occluded the roux limb with a bowel clamp and the upper abdomen was flooded with irrigation to perform a leak test, which was negative. No bubbles were seen.  The staple line and anastomosis are hemostatic. There is no retained fundus. The anastomosis is visibly patent and measures 43cm from the teeth. The lumen was decompressed and the scope was withdrawn without difficulty.    Berna Bue, M.D. General, Bariatric, & Minimally Invasive Surgery [facility] [place]

## 2020-07-07 NOTE — Anesthesia Procedure Notes (Signed)
Procedure Name: Intubation Date/Time: 07/07/2020 11:15 AM Performed by: Niel Hummer, CRNA Pre-anesthesia Checklist: Patient identified, Emergency Drugs available, Suction available and Patient being monitored Patient Re-evaluated:Patient Re-evaluated prior to induction Oxygen Delivery Method: Circle system utilized Preoxygenation: Pre-oxygenation with 100% oxygen Induction Type: IV induction Ventilation: Mask ventilation without difficulty Laryngoscope Size: Mac and 4 Grade View: Grade I Tube type: Oral Tube size: 7.0 mm Number of attempts: 1 Airway Equipment and Method: Stylet Placement Confirmation: ETT inserted through vocal cords under direct vision,  positive ETCO2 and breath sounds checked- equal and bilateral Secured at: 22 cm Tube secured with: Tape Dental Injury: Teeth and Oropharynx as per pre-operative assessment

## 2020-07-08 ENCOUNTER — Encounter (HOSPITAL_COMMUNITY): Payer: Self-pay | Admitting: General Surgery

## 2020-07-08 LAB — CBC WITH DIFFERENTIAL/PLATELET
Abs Immature Granulocytes: 0.06 10*3/uL (ref 0.00–0.07)
Basophils Absolute: 0 10*3/uL (ref 0.0–0.1)
Basophils Relative: 0 %
Eosinophils Absolute: 0 10*3/uL (ref 0.0–0.5)
Eosinophils Relative: 0 %
HCT: 38.6 % (ref 36.0–46.0)
Hemoglobin: 12 g/dL (ref 12.0–15.0)
Immature Granulocytes: 0 %
Lymphocytes Relative: 13 %
Lymphs Abs: 2 10*3/uL (ref 0.7–4.0)
MCH: 29.1 pg (ref 26.0–34.0)
MCHC: 31.1 g/dL (ref 30.0–36.0)
MCV: 93.7 fL (ref 80.0–100.0)
Monocytes Absolute: 0.7 10*3/uL (ref 0.1–1.0)
Monocytes Relative: 5 %
Neutro Abs: 12.1 10*3/uL — ABNORMAL HIGH (ref 1.7–7.7)
Neutrophils Relative %: 82 %
Platelets: 259 10*3/uL (ref 150–400)
RBC: 4.12 MIL/uL (ref 3.87–5.11)
RDW: 14.6 % (ref 11.5–15.5)
WBC: 14.8 10*3/uL — ABNORMAL HIGH (ref 4.0–10.5)
nRBC: 0 % (ref 0.0–0.2)

## 2020-07-08 LAB — GLUCOSE, CAPILLARY
Glucose-Capillary: 123 mg/dL — ABNORMAL HIGH (ref 70–99)
Glucose-Capillary: 110 mg/dL — ABNORMAL HIGH (ref 70–99)
Glucose-Capillary: 96 mg/dL (ref 70–99)
Glucose-Capillary: 160 mg/dL — ABNORMAL HIGH (ref 70–99)
Glucose-Capillary: 83 mg/dL (ref 70–99)
Glucose-Capillary: 86 mg/dL (ref 70–99)

## 2020-07-08 LAB — COMPREHENSIVE METABOLIC PANEL
ALT: 33 U/L (ref 0–44)
AST: 32 U/L (ref 15–41)
Albumin: 3.4 g/dL — ABNORMAL LOW (ref 3.5–5.0)
Alkaline Phosphatase: 48 U/L (ref 38–126)
Anion gap: 8 (ref 5–15)
BUN: 10 mg/dL (ref 6–20)
CO2: 23 mmol/L (ref 22–32)
Calcium: 8.7 mg/dL — ABNORMAL LOW (ref 8.9–10.3)
Chloride: 105 mmol/L (ref 98–111)
Creatinine, Ser: 0.69 mg/dL (ref 0.44–1.00)
GFR, Estimated: >60 mL/min (ref >60)
Glucose, Bld: 131 mg/dL — ABNORMAL HIGH (ref 70–99)
Potassium: 4.5 mmol/L (ref 3.5–5.1)
Sodium: 136 mmol/L (ref 135–145)
Total Bilirubin: 0.9 mg/dL (ref 0.3–1.2)
Total Protein: 7 g/dL (ref 6.5–8.1)

## 2020-07-08 NOTE — Progress Notes (Signed)
Nutrition Education Note ° °Received consult for diet education for patient s/p bariatric surgery. ° °Discussed 2 week post op diet with pt. Emphasized that liquids must be non carbonated, non caffeinated, and sugar free. Fluid goals discussed. Pt to follow up with outpatient bariatric RD for further diet progression after 2 weeks. Multivitamins and minerals also reviewed. Teach back method used, pt expressed understanding, expect good compliance. ° °If nutrition issues arise, please consult RD. ° °Gloria Przybylski, MS, RD, LDN °Inpatient Clinical Dietitian °Contact information available via Amion ° ° °

## 2020-07-08 NOTE — Progress Notes (Signed)
Patient alert and oriented, Post op day 1.  Provided support and encouragement.  Encouraged pulmonary toilet, ambulation and small sips of liquids.  All questions answered.  Will continue to monitor. 

## 2020-07-08 NOTE — Anesthesia Postprocedure Evaluation (Signed)
Anesthesia Post Note  Patient: Gloria Kim  Procedure(s) Performed: LAPAROSCOPIC ROUX-EN-Y GASTRIC BYPASS WITH UPPER ENDOSCOPY (N/A ) UPPER GI ENDOSCOPY (N/A )     Patient location during evaluation: PACU Anesthesia Type: General Level of consciousness: awake and alert and oriented Pain management: pain level controlled Vital Signs Assessment: post-procedure vital signs reviewed and stable Respiratory status: spontaneous breathing, nonlabored ventilation and respiratory function stable Cardiovascular status: blood pressure returned to baseline Postop Assessment: no apparent nausea or vomiting Anesthetic complications: no   No complications documented.             Kaylyn Layer

## 2020-07-08 NOTE — Progress Notes (Signed)
Patient alert and oriented, pain is controlled. Patient is tolerating fluids, advanced to protein shake today, patient is tolerating well. Reviewed Gastric Bypass discharge instructions with patient and patient is able to articulate understanding. Provided information on BELT program, Support Group and [facility] outpatient pharmacy. All questions answered, will continue to monitor.    

## 2020-07-08 NOTE — Discharge Instructions (Signed)
GASTRIC BYPASS / SLEEVE  Home Care Instructions  These instructions are to help you care for yourself when you go home.  Call: If you have any problems. . Call [phone] and ask for the surgeon on call . If you have an emergency related to your surgery please use the ER at [facility].  . Tell the ER staff that you are a new post-op gastric bypass or gastric sleeve patient   Signs and symptoms to report: . Severe vomiting or nausea o If you cannot handle clear liquids for longer than 1 day, call your surgeon  . Abdominal pain which does not get better after taking your pain medication . Fever greater than 100.4 F and chills . Heart rate over 100 beats a minute . Trouble breathing . Chest pain .  Redness, swelling, drainage, or foul odor at incision (surgical) sites .  If your incisions open or pull apart . Swelling or pain in calf (lower leg) . Diarrhea (Loose bowel movements that happen often), frequent watery, uncontrolled bowel movements . Constipation, (no bowel movements for 3 days) if this happens:  o Take Milk of Magnesia, 2 tablespoons by mouth, 3 times a day for 2 days if needed o Stop taking Milk of Magnesia once you have had a bowel movement o Call your doctor if constipation continues Or o Take Miralax  (instead of Milk of Magnesia) following the label instructions o Stop taking Miralax once you have had a bowel movement o Call your doctor if constipation continues . Anything you think is "abnormal for you"   Normal side effects after surgery: . Unable to sleep at night or unable to concentrate . Irritability . Being tearful (crying) or depressed These are common complaints, possibly related to your anesthesia, stress of surgery and change in lifestyle, that usually go away a few weeks after surgery.  If these feelings continue, call your medical doctor.  Wound Care: You may have surgical glue, steri-strips, or staples over your incisions after surgery . Surgical  glue:  Looks like a clear film over your incisions and will wear off a little at a time . Steri-strips : Adhesive strips of tape over your incisions. You may notice a yellowish color on the skin under the steri-strips. This is used to make the   steri-strips stick better. Do not pull the steri-strips off - let them fall off . Staples: Staples may be removed before you leave the hospital o If you go home with staples, call [facility] at for an appointment with your surgeon's nurse to have staples removed 10 days after surgery, [phone] . Showering: You may shower two (2) days after your surgery unless your surgeon tells you differently o Wash gently around incisions with warm soapy water, rinse well, and gently pat dry  o If you have a drain (tube from your incision), you may need someone to hold this while you shower  o No tub baths until staples are removed and incisions are healed     Medications: . Medications should be liquid or crushed if larger than the size of a dime . Extended release pills (medication that releases a little bit at a time through the day) should not be crushed . Depending on the size and number of medications you take, you may need to space (take a few throughout the day)/change the time you take your medications so that you do not over-fill your pouch (smaller stomach) . Make sure you follow-up with   your primary care physician to make medication changes needed during rapid weight loss and life-style changes . If you have diabetes, follow up with the doctor that orders your diabetes medication(s) within one week after surgery and check your blood sugar regularly. . Do not drive while taking narcotics (pain medications) . DO NOT take NSAID'S (Examples of NSAID's include ibuprofen, naproxen)  Diet:                    First 2 Weeks  You will see the nutritionist about two (2) weeks after your surgery. The nutritionist will increase the types of foods you can  eat if you are handling liquids well: . If you have severe vomiting or nausea and cannot handle clear liquids lasting longer than 1 day, call your surgeon  Protein Shake . Drink at least 2 ounces of shake 5-6 times per day . Each serving of protein shakes (usually 8 - 12 ounces) should have a minimum of:  o 15 grams of protein  o And no more than 5 grams of carbohydrate  . Goal for protein each day: o Men = 80 grams per day o Women = 60 grams per day . Protein powder may be added to fluids such as non-fat milk or Lactaid milk or Soy milk (limit to 35 grams added protein powder per serving)  Hydration . Slowly increase the amount of water and other clear liquids as tolerated (See Acceptable Fluids) . Slowly increase the amount of protein shake as tolerated  .  Sip fluids slowly and throughout the day . May use sugar substitutes in small amounts (no more than 6 - 8 packets per day; i.e. Splenda)  Fluid Goal . The first goal is to drink at least 8 ounces of protein shake/drink per day (or as directed by the nutritionist);  See handout from pre-op Bariatric Education Class for examples of protein shake/drink.   o Slowly increase the amount of protein shake you drink as tolerated o You may find it easier to slowly sip shakes throughout the day o It is important to get your proteins in first . Your fluid goal is to drink 64 - 100 ounces of fluid daily o It may take a few weeks to build up to this . 32 oz (or more) should be clear liquids  And  . 32 oz (or more) should be full liquids (see below for examples) . Liquids should not contain sugar, caffeine, or carbonation  Clear Liquids: . Water or Sugar-free flavored water (i.e. Fruit H2O, Propel) . Decaffeinated coffee or tea (sugar-free) . Joshawa Dubin Lite, Wyler's Lite, Minute Maid Lite . Sugar-free Jell-O . Bouillon or broth . Sugar-free Popsicle:   *Less than 20 calories each; Limit 1 per day  Full Liquids: Protein Shakes/Drinks + 2  choices per day of other full liquids . Full liquids must be: o No More Than 12 grams of Carbs per serving  o No More Than 3 grams of Fat per serving . Strained low-fat cream soup . Non-Fat milk . Fat-free Lactaid Milk . Sugar-free yogurt (Dannon Lite & Fit, Greek yogurt)      Vitamins and Minerals . Start 1 day after surgery unless otherwise directed by your surgeon . Bariatric Specific Complete Multivitamins . Chewable Calcium Citrate with Vitamin D-3 (Example: 3 Chewable Calcium Plus 600 with Vitamin D-3) o Take 500 mg three (3) times a day for a total of 1500 mg each day o Do not take all 3 doses   of calcium at one time as it may cause constipation, and you can only absorb 500 mg  at a time  o Do not mix multivitamins containing iron with calcium supplements; take 2 hours apart  . Menstruating women and those at risk for anemia (a blood disease that causes weakness) may need extra iron o Talk with your doctor to see if you need more iron . If you need extra iron: Total daily Iron recommendation (including Vitamins) is 50 to 100 mg Iron/day . Do not stop taking or change any vitamins or minerals until you talk to your nutritionist or surgeon . Your nutritionist and/or surgeon must approve all vitamin and mineral supplements   Activity and Exercise: It is important to continue walking at home.  Limit your physical activity as instructed by your doctor.  During this time, use these guidelines: . Do not lift anything greater than ten (10) pounds for at least two (2) weeks . Do not go back to work or drive until your surgeon says you can . You may have sex when you feel comfortable  o It is VERY important for female patients to use a reliable birth control method; fertility often increases after surgery  o Do not get pregnant for at least 18 months . Start exercising as soon as your doctor tells you that you can o Make sure your doctor approves any physical activity . Start with a  simple walking program . Walk 5-15 minutes each day, 7 days per week.  . Slowly increase until you are walking 30-45 minutes per day Consider joining our BELT program. [phone] or email [email or web address]   Special Instructions Things to remember:  . Use your CPAP when sleeping if this applies to you, do not stop the use of CPAP unless directed by physician after a sleep study . [facility] has a free Bariatric Surgery Support Group that meets monthly, the 3rd Thursday, 6 pm.  Please review discharge information for date and location of this meeting. . It is very important to keep all follow up appointments with your surgeon, nutritionist, primary care physician, and behavioral health practitioner o After the first year, please follow up with your bariatric surgeon and nutritionist at least once a year in order to maintain best weight loss results   [facility]: [phone] [facility]: [phone] Bariatric Nurse Coordinator: [phone]      

## 2020-07-09 LAB — CBC WITH DIFFERENTIAL/PLATELET
Abs Immature Granulocytes: 0.05 10*3/uL (ref 0.00–0.07)
Basophils Absolute: 0.1 10*3/uL (ref 0.0–0.1)
Basophils Relative: 0 %
Eosinophils Absolute: 0.1 10*3/uL (ref 0.0–0.5)
Eosinophils Relative: 1 %
HCT: 38.3 % (ref 36.0–46.0)
Hemoglobin: 11.7 g/dL — ABNORMAL LOW (ref 12.0–15.0)
Immature Granulocytes: 0 %
Lymphocytes Relative: 33 %
Lymphs Abs: 3.7 10*3/uL (ref 0.7–4.0)
MCH: 28.7 pg (ref 26.0–34.0)
MCHC: 30.5 g/dL (ref 30.0–36.0)
MCV: 94.1 fL (ref 80.0–100.0)
Monocytes Absolute: 0.6 10*3/uL (ref 0.1–1.0)
Monocytes Relative: 5 %
Neutro Abs: 6.7 10*3/uL (ref 1.7–7.7)
Neutrophils Relative %: 61 %
Platelets: 249 10*3/uL (ref 150–400)
RBC: 4.07 MIL/uL (ref 3.87–5.11)
RDW: 14.8 % (ref 11.5–15.5)
WBC: 11.2 10*3/uL — ABNORMAL HIGH (ref 4.0–10.5)
nRBC: 0 % (ref 0.0–0.2)

## 2020-07-09 LAB — GLUCOSE, CAPILLARY
Glucose-Capillary: 123 mg/dL — ABNORMAL HIGH (ref 70–99)
Glucose-Capillary: 138 mg/dL — ABNORMAL HIGH (ref 70–99)
Glucose-Capillary: 78 mg/dL (ref 70–99)
Glucose-Capillary: 93 mg/dL (ref 70–99)

## 2020-07-09 MED ORDER — PANTOPRAZOLE SODIUM 40 MG PO TBEC: 40.0000 mg | DELAYED_RELEASE_TABLET | Freq: Every day | ORAL | 0 refills | Status: DC

## 2020-07-09 MED ORDER — ONDANSETRON 4 MG PO TBDP: 4.0000 mg | ORAL_TABLET | Freq: Four times a day (QID) | ORAL | 0 refills | Status: DC | PRN

## 2020-07-09 MED ORDER — ACETAMINOPHEN 500 MG PO TABS: 1000.0000 mg | ORAL_TABLET | Freq: Three times a day (TID) | ORAL | 0 refills | Status: AC

## 2020-07-09 MED ORDER — GABAPENTIN 100 MG PO CAPS: 200.0000 mg | ORAL_CAPSULE | Freq: Two times a day (BID) | ORAL | 0 refills | Status: DC

## 2020-07-09 MED ORDER — LIP MEDEX EX OINT
TOPICAL_OINTMENT | CUTANEOUS | Status: AC
  Filled 2020-07-09: qty 7

## 2020-07-09 MED ORDER — OXYCODONE HCL 5 MG PO TABS: 5.0000 mg | ORAL_TABLET | Freq: Four times a day (QID) | ORAL | 0 refills | Status: DC | PRN

## 2020-07-09 NOTE — Progress Notes (Signed)
1 Days Post-Op  Delayed note entry -pt seen and examined yesterday twice Subjective/Chief Complaint: Pt having nausea. Some addl pain Still on water  Objective: Vital signs in last 24 hours: Temp:  [98 F (36.7 C)-98.9 F (37.2 C)] 98.4 F (36.9 C) (03/24 0922) Pulse Rate:  [65-80] 72 (03/24 0922) Resp:  [17-20] 18 (03/24 0922) BP: (112-138)/(69-81) 129/81 (03/24 0922) SpO2:  [96 %-100 %] 98 % (03/24 0922) Last BM Date: 07/05/20  Intake/Output from previous day: 03/23 0701 - 03/24 0700 In: 4533 [P.O.:2533; I.V.:2000] Out: 900 [Urine:900] Intake/Output this shift: Total I/O In: 60 [P.O.:60] Out: 850 [Urine:850]  Alert, nontoxic symm chest rise, nonlabored Obese, soft, mild approp TTP No edema  Lab Results:  Recent Labs    07/08/20 0453 07/09/20 0442  WBC 14.8* 11.2*  HGB 12.0 11.7*  HCT 38.6 38.3  PLT 259 249   BMET Recent Labs    07/08/20 0453  NA 136  K 4.5  CL 105  CO2 23  GLUCOSE 131*  BUN 10  CREATININE 0.69  CALCIUM 8.7*   PT/INR No results for input(s): LABPROT, INR in the last 72 hours. ABG No results for input(s): PHART, HCO3 in the last 72 hours.  Invalid input(s): PCO2, PO2  Studies/Results: No results found.  Anti-infectives: Anti-infectives (From admission, onward)   Start     Dose/Rate Route Frequency Ordered Stop   07/07/20 0915  cefoTEtan (CEFOTAN) 2 g in sodium chloride 0.9 % 100 mL IVPB        2 g 200 mL/hr over 30 Minutes Intravenous On call to O.R. 07/07/20 0904 07/07/20 1145      Assessment/Plan: s/p Procedure(s) with comments: LAPAROSCOPIC ROUX-EN-Y GASTRIC BYPASS WITH UPPER ENDOSCOPY (N/A) - 3 HOURS UPPER GI ENDOSCOPY (N/A)  Vitals reviewed. No tachy. Small bump in wbc Ambulate, pulm toilet, is Cont lovenox Unfortunately, pt not taking in enough PO (protein to be safe for discharge on 3/23)  [name]. [name] Campanile, MD, FACS General, Bariatric, & Minimally Invasive Surgery [facility], [place]   LOS: 1 days     Gaynelle Adu 07/09/2020

## 2020-07-09 NOTE — Discharge Summary (Signed)
Physician Discharge Summary  Karalee Hauter XBM:[id number] DOB: February 24, 1976 DOA: 07/07/2020  [facility]  Admit date: 07/07/2020 Discharge date: 07/09/2020  Recommendations for Outpatient Follow-up:     Follow-up Information    Gaynelle Adu, MD. Go on 07/29/2020.   Specialty: General Surgery Why: at 11:45am.  Please arrive 15 minutes prior to your appointment time.  Thank you. Contact information: [address] [place] [address] [phone]        Hedda Slade, PA-C. Go on 08/25/2020.   Specialty: General Surgery Why: at 4:00pm for Dr. [name] Campanile.  Please arrive 15 minutes prior to your appointment time.  Thank you. Contact information: [address] [place] [address] [phone]              Discharge Diagnoses:  Active Problems:   Diabetes mellitus with coincident hypertension (HCC)   Other hyperlipidemia   Morbid obesity (HCC)   Migraine headache   Asthma   GERD (gastroesophageal reflux disease)   Chronic pain of right knee   S/P gastric bypass   Surgical Procedure: Laparoscopic Roux-en-Y gastric bypass, upper endoscopy  Discharge Condition: Good Disposition: Home  Diet recommendation: Postoperative gastric bypass diet  Filed Weights   07/07/20 0922  Weight: (!) 136.5 kg     Hospital Course:  The patient was admitted for a planned laparoscopic Roux-en-Y gastric bypass. Please see operative note. Preoperatively the patient was given 5000 units of subcutaneous heparin for DVT prophylaxis. ERAS protocol was used. Postoperative prophylactic Lovenox dosing was started on the evening of postoperative day 0.  The patient was started on ice chips and water on the evening of POD 0 which they tolerated. On postoperative day 1 The patient's diet was advanced to protein shakes which they also tolerated however she did not drink enough to meet dc criteria. On POD 2, The patient was ambulating without difficulty.  Their vital signs are stable without fever or tachycardia. Their hemoglobin had remained stable.  The patient had received discharge instructions and counseling. Her PO intake had improved. They were deemed stable for discharge.  BP 137/74 (BP Location: Right Wrist)   Pulse 75   Temp 98.3 F (36.8 C) (Oral)   Resp 17   Ht 5\' 2"  (1.575 m)   Wt (!) 136.5 kg   LMP 06/15/2020 (Approximate)   SpO2 98%   BMI 55.05 kg/m   Gen: alert, NAD, non-toxic appearing Pupils: equal, no scleral icterus Pulm: Lungs clear to auscultation, symmetric chest rise CV: regular rate and rhythm Abd: soft, min tender, nondistended. No cellulitis. No incisional hernia Ext: no edema, no calf tenderness Skin: no rash, no jaundice  Discharge Instructions  Discharge Instructions    Ambulate hourly while awake   Complete by: As directed    Call MD for:  difficulty breathing, headache or visual disturbances   Complete by: As directed    Call MD for:  persistant dizziness or light-headedness   Complete by: As directed    Call MD for:  persistant nausea and vomiting   Complete by: As directed    Call MD for:  redness, tenderness, or signs of infection (pain, swelling, redness, odor or green/yellow discharge around incision site)   Complete by: As directed    Call MD for:  severe uncontrolled pain   Complete by: As directed    Call MD for:  temperature >101 F   Complete by: As directed    Diet bariatric full liquid  Complete by: As directed    Discharge instructions   Complete by: As directed    See bariatric discharge instructions   Incentive spirometry   Complete by: As directed    Perform hourly while awake     Allergies as of 07/09/2020      Reactions   Shrimp [shellfish Allergy] Anaphylaxis   Nickel Rash   Tramadol Hives, Nausea Only   Latex Rash      Medication List    STOP taking these medications   albuterol (2.5 MG/3ML) 0.083% nebulizer solution Commonly known as: PROVENTIL    albuterol 108 (90 Base) MCG/ACT inhaler Commonly known as: VENTOLIN HFA   metFORMIN 500 MG 24 hr tablet Commonly known as: GLUCOPHAGE-XR   naproxen 500 MG tablet Commonly known as: NAPROSYN   oxyCODONE-acetaminophen 5-325 MG tablet Commonly known as: Percocet     TAKE these medications   acetaminophen 500 MG tablet Commonly known as: TYLENOL Take 2 tablets (1,000 mg total) by mouth every 8 (eight) hours for 5 days. What changed:   medication strength  how much to take  when to take this  reasons to take this   atorvastatin 10 MG tablet Commonly known as: LIPITOR Take 10 mg by mouth daily.   gabapentin 100 MG capsule Commonly known as: NEURONTIN Take 2 capsules (200 mg total) by mouth every 12 (twelve) hours.   hydroxypropyl methylcellulose / hypromellose 2.5 % ophthalmic solution Commonly known as: ISOPTO TEARS / GONIOVISC Place 1 drop into both eyes 3 (three) times daily as needed for dry eyes.   lisinopril 2.5 MG tablet Commonly known as: ZESTRIL Take 2.5 mg by mouth daily. Notes to patient: Monitor Blood Pressure Daily and keep a log for primary care physician.  You may need to make changes to your medications with rapid weight loss.     ondansetron 4 MG disintegrating tablet Commonly known as: ZOFRAN-ODT Take 1 tablet (4 mg total) by mouth every 6 (six) hours as needed for nausea or vomiting.   oxyCODONE 5 MG immediate release tablet Commonly known as: Oxy IR/ROXICODONE Take 1 tablet (5 mg total) by mouth every 6 (six) hours as needed for severe pain.   pantoprazole 40 MG tablet Commonly known as: PROTONIX Take 1 tablet (40 mg total) by mouth daily.   traZODone 50 MG tablet Commonly known as: DESYREL Take 50 mg by mouth at bedtime as needed for sleep.   triamcinolone ointment 0.1 % Commonly known as: KENALOG Apply 1 application topically daily as needed (eczema).       Follow-up Information    Gaynelle Adu, MD. Go on 07/29/2020.   Specialty:  General Surgery Why: at 11:45am.  Please arrive 15 minutes prior to your appointment time.  Thank you. Contact information: [address] [place] [address] [phone]        Hedda Slade, PA-C. Go on 08/25/2020.   Specialty: General Surgery Why: at 4:00pm for Dr. [name] Campanile.  Please arrive 15 minutes prior to your appointment time.  Thank you. Contact information: [address] [place] [address] [phone]                The results of significant diagnostics from this hospitalization (including imaging, microbiology, ancillary and laboratory) are listed below for reference.    Significant Diagnostic Studies: No results found.  Labs: Basic Metabolic Panel: Recent Labs  Lab 07/08/20 0453  NA 136  K 4.5  CL 105  CO2 23  GLUCOSE  131*  BUN 10  CREATININE 0.69  CALCIUM 8.7*   Liver Function Tests: Recent Labs  Lab 07/08/20 0453  AST 32  ALT 33  ALKPHOS 48  BILITOT 0.9  PROT 7.0  ALBUMIN 3.4*    CBC: Recent Labs  Lab 07/07/20 1449 07/08/20 0453 07/09/20 0442  WBC  --  14.8* 11.2*  NEUTROABS  --  12.1* 6.7  HGB 12.9 12.0 11.7*  HCT 42.1 38.6 38.3  MCV  --  93.7 94.1  PLT  --  259 249    CBG: Recent Labs  Lab 07/08/20 1958 07/09/20 0001 07/09/20 0422 07/09/20 0718 07/09/20 1103  GLUCAP 86 138* 93 123* 78    Active Problems:   Diabetes mellitus with coincident hypertension (HCC)   Other hyperlipidemia   Morbid obesity (HCC)   Migraine headache   Asthma   GERD (gastroesophageal reflux disease)   Chronic pain of right knee   S/P gastric bypass   Time coordinating discharge: 15 min  Signed:  Atilano Ina, MD [facility], [place] [phone] 07/09/2020, 3:24 PM

## 2020-07-09 NOTE — Progress Notes (Signed)
Discharge instructions given to patient and all questions were answered.  

## 2020-07-14 ENCOUNTER — Telehealth (HOSPITAL_COMMUNITY): Payer: Self-pay | Admitting: *Deleted

## 2020-07-14 NOTE — Telephone Encounter (Signed)
Patient called to discuss post bariatric surgery follow up questions.  See below:  1.  Tell me about your pain and pain management? Pt denies any pain.   2.  Let's talk about fluid intake.  How much total fluid are you taking in? Pt states that she is getting in at least 64oz of fluid including protein shakes, bottled water, and Gatorade Zero.   3.  How much protein have you taken in the last 2 days? Pt states she is meeting her goal of 60g of protein each day with the protein shakes and protein yogurt.   4.  Have you had nausea?  Tell me about when have experienced nausea and what you did to help? Pt denies nausea.   5.  Has the frequency or color changed with your urine? Pt states that she is urinating "fine" with no changes in frequency or urgency.     6.  Tell me what your incisions look like? "Incisions look fine". States that the steri strips have fallen off some of her incisions. Pt denies a fever, chills.  Pt states incisions are not swollen, open, or draining.  Pt encouraged to call [facility] if incisions change.   7.  Have you been passing gas? BM? Pt states that she has not had a BM since prior to going to the hospital.  Pt denies abdominal pain, bloating, nausea and/or vomiting.  Pt states that she is passing gas and has taken Miralax for the past four days.  Pt encouraged to call [facility] is no BM within the next 24 hours or develops acute symptoms.     8.  If a problem or question were to arise who would you call?  Do you know contact numbers for [facility], [facility], and [facility]? Pt denies dehydration symptoms.  Pt can describe s/sx of dehydration.  Pt knows to call [facility] for surgical, [facility] for nutrition, and [facility] for non-urgent questions or concerns.   9.  How has the walking going? Pt states she is walking around and able to be active without difficulty.   10.  How are your vitamins and calcium going?  How are you taking them? Pt states that she is taking her supplements and vitamins without  difficulty.     Reminded patient that first 30 days post-operatively are important for successful recovery.  Practice good hand hygiene, wearing a mask when appropriate (since optional in most places), and minimizing exposure to people who live outside of the home, especially if they are exhibiting any respiratory, GI, or illness-like symptoms.

## 2020-07-21 ENCOUNTER — Other Ambulatory Visit: Payer: Self-pay

## 2020-07-21 ENCOUNTER — Encounter: Payer: BC Managed Care – PPO | Attending: General Surgery | Admitting: Skilled Nursing Facility1

## 2020-07-21 DIAGNOSIS — E119 Type 2 diabetes mellitus without complications: Secondary | ICD-10-CM | POA: Insufficient documentation

## 2020-07-21 DIAGNOSIS — I10 Essential (primary) hypertension: Secondary | ICD-10-CM | POA: Insufficient documentation

## 2020-07-22 NOTE — Progress Notes (Signed)
2 Week Post-Operative Nutrition Class   Patient was seen on 06/12/18 for Post-Operative Nutrition education at [facility]    Surgery date: 07/07/2020 Surgery type: RYGB Start weight at [facility]: 309.8 Weight today: 289.6   Body Composition Scale 07/22/2020  Current Body Weight 289.6  Total Body Fat % 49.5  Visceral Fat 21  Fat-Free Mass % 50.4   Total Body Water % 39.7  Muscle-Mass lbs 30.8  BMI 52.9  Body Fat Displacement          Torso  lbs 89         Left Leg  lbs 17.8         Right Leg  lbs 17.8         Left Arm  lbs 8.9         Right Arm   lbs 8.9      The following the learning objectives were met by the patient during this course:  Identifies Phase 3 (Soft, High Proteins) Dietary Goals and will begin from 2 weeks post-operatively to 2 months post-operatively  Identifies appropriate sources of fluids and proteins   Identifies appropriate fat sources and healthy verses unhealthy fat types    States protein recommendations and appropriate sources post-operatively  Identifies the need for appropriate texture modifications, mastication, and bite sizes when consuming solids  Identifies appropriate multivitamin and calcium sources post-operatively  Describes the need for physical activity post-operatively and will follow MD recommendations  States when to call healthcare provider regarding medication questions or post-operative complications   Handouts given during class include:  Phase 3A: Soft, High Protein Diet Handout  Phase 3 High Protein Meals  Healthy Fats   Follow-Up Plan: Patient will follow-up at [facility] in 6 weeks for 2 month post-op nutrition visit for diet advancement per MD.

## 2020-07-27 ENCOUNTER — Telehealth: Payer: Self-pay | Admitting: Skilled Nursing Facility1

## 2020-07-27 NOTE — Telephone Encounter (Signed)
RD called pt to verify fluid intake once starting soft, solid proteins 2 week post-bariatric surgery.   Daily Fluid intake: 64oz Daily Protein intake: 60g  Concerns/issues:   Drank carbonated beverage to help her burp stating she went back to only liquids today because today has been rough but other than that fine. Pt states she did go back to liquids because she was worried she would stretch her pouch. Pt state she is a little constipated taking stool softener and mirilax.

## 2020-08-03 ENCOUNTER — Telehealth (HOSPITAL_COMMUNITY): Payer: Self-pay | Admitting: *Deleted

## 2020-08-04 NOTE — Telephone Encounter (Signed)
Spoke with patient about fluid intake and diet progression.  Pt is unable to state approximate amount of fluid intake but states that she is doing "much better than when I was in the office".  Pt has not been able to obtain alkaline water per MD suggestion, but states that she will go and purchase some.   Pt denies dehydration symptoms.  Pt can describe s/sx of dehydration. Pt knows to call [facility] for surgical, [facility] for nutrition, and [facility] for non-urgent questions or concerns.  Will f/u as needed.

## 2020-08-17 ENCOUNTER — Other Ambulatory Visit: Payer: Self-pay

## 2020-08-17 ENCOUNTER — Encounter (HOSPITAL_COMMUNITY): Payer: Self-pay

## 2020-08-17 ENCOUNTER — Emergency Department (HOSPITAL_COMMUNITY): Payer: BC Managed Care – PPO

## 2020-08-17 ENCOUNTER — Emergency Department (HOSPITAL_COMMUNITY)
Admission: EM | Admit: 2020-08-17 | Discharge: 2020-08-17 | Disposition: A | Discharge status: Home / Self Care | Payer: BC Managed Care – PPO | Attending: Emergency Medicine | Admitting: Emergency Medicine

## 2020-08-17 ENCOUNTER — Emergency Department (HOSPITAL_COMMUNITY)
Admission: EM | Admit: 2020-08-17 | Discharge: 2020-08-17 | Disposition: A | Discharge status: Home / Self Care | Payer: BC Managed Care – PPO | Source: Home / Self Care | Attending: Emergency Medicine | Admitting: Emergency Medicine

## 2020-08-17 ENCOUNTER — Encounter (HOSPITAL_COMMUNITY): Payer: Self-pay | Admitting: Emergency Medicine

## 2020-08-17 DIAGNOSIS — Z91040 Latex allergy status: Secondary | ICD-10-CM | POA: Insufficient documentation

## 2020-08-17 DIAGNOSIS — J45909 Unspecified asthma, uncomplicated: Secondary | ICD-10-CM | POA: Insufficient documentation

## 2020-08-17 DIAGNOSIS — J181 Lobar pneumonia, unspecified organism: Secondary | ICD-10-CM | POA: Insufficient documentation

## 2020-08-17 DIAGNOSIS — R1013 Epigastric pain: Secondary | ICD-10-CM | POA: Diagnosis not present

## 2020-08-17 DIAGNOSIS — E119 Type 2 diabetes mellitus without complications: Secondary | ICD-10-CM | POA: Insufficient documentation

## 2020-08-17 DIAGNOSIS — Z20822 Contact with and (suspected) exposure to covid-19: Secondary | ICD-10-CM | POA: Insufficient documentation

## 2020-08-17 DIAGNOSIS — I10 Essential (primary) hypertension: Secondary | ICD-10-CM | POA: Insufficient documentation

## 2020-08-17 DIAGNOSIS — J189 Pneumonia, unspecified organism: Secondary | ICD-10-CM

## 2020-08-17 DIAGNOSIS — R0602 Shortness of breath: Secondary | ICD-10-CM | POA: Diagnosis present

## 2020-08-17 LAB — CBC
HCT: 41.2 % (ref 36.0–46.0)
Hemoglobin: 12.5 g/dL (ref 12.0–15.0)
MCH: 28.7 pg (ref 26.0–34.0)
MCHC: 30.3 g/dL (ref 30.0–36.0)
MCV: 94.7 fL (ref 80.0–100.0)
Platelets: 239 10*3/uL (ref 150–400)
RBC: 4.35 MIL/uL (ref 3.87–5.11)
RDW: 15.1 % (ref 11.5–15.5)
WBC: 12.4 10*3/uL — ABNORMAL HIGH (ref 4.0–10.5)
nRBC: 0 % (ref 0.0–0.2)

## 2020-08-17 LAB — I-STAT BETA HCG BLOOD, ED (MC, WL, AP ONLY): I-stat hCG, quantitative: <5 m[IU]/mL (ref <5)

## 2020-08-17 LAB — HEPATIC FUNCTION PANEL
ALT: 19 U/L (ref 0–44)
AST: 41 U/L (ref 15–41)
Albumin: 3.7 g/dL (ref 3.5–5.0)
Alkaline Phosphatase: 65 U/L (ref 38–126)
Bilirubin, Direct: 0.5 mg/dL — ABNORMAL HIGH (ref 0.0–0.2)
Indirect Bilirubin: 1 mg/dL — ABNORMAL HIGH (ref 0.3–0.9)
Total Bilirubin: 1.5 mg/dL — ABNORMAL HIGH (ref 0.3–1.2)
Total Protein: 7.5 g/dL (ref 6.5–8.1)

## 2020-08-17 LAB — TROPONIN I (HIGH SENSITIVITY)
Troponin I (High Sensitivity): <2 ng/L (ref <18)
Troponin I (High Sensitivity): 2 ng/L (ref <18)

## 2020-08-17 LAB — RESP PANEL BY RT-PCR (FLU A&B, COVID) ARPGX2
Influenza A by PCR: NEGATIVE
Influenza B by PCR: NEGATIVE
SARS Coronavirus 2 by RT PCR: NEGATIVE

## 2020-08-17 LAB — URINALYSIS, ROUTINE W REFLEX MICROSCOPIC
Bilirubin Urine: NEGATIVE
Glucose, UA: NEGATIVE mg/dL
Hgb urine dipstick: NEGATIVE
Ketones, ur: NEGATIVE mg/dL
Leukocytes,Ua: NEGATIVE
Nitrite: NEGATIVE
Protein, ur: NEGATIVE mg/dL
Specific Gravity, Urine: 1.016 (ref 1.005–1.030)
pH: 9 — ABNORMAL HIGH (ref 5.0–8.0)

## 2020-08-17 LAB — BASIC METABOLIC PANEL
Anion gap: 10 (ref 5–15)
BUN: 7 mg/dL (ref 6–20)
CO2: 24 mmol/L (ref 22–32)
Calcium: 9.1 mg/dL (ref 8.9–10.3)
Chloride: 109 mmol/L (ref 98–111)
Creatinine, Ser: 0.8 mg/dL (ref 0.44–1.00)
GFR, Estimated: >60 mL/min (ref >60)
Glucose, Bld: 121 mg/dL — ABNORMAL HIGH (ref 70–99)
Potassium: 5.5 mmol/L — ABNORMAL HIGH (ref 3.5–5.1)
Sodium: 143 mmol/L (ref 135–145)

## 2020-08-17 LAB — D-DIMER, QUANTITATIVE: D-Dimer, Quant: 0.45 ug/mL-FEU (ref 0.00–0.50)

## 2020-08-17 LAB — CBG MONITORING, ED: Glucose-Capillary: 122 mg/dL — ABNORMAL HIGH (ref 70–99)

## 2020-08-17 LAB — LIPASE, BLOOD: Lipase: 20 U/L (ref 11–51)

## 2020-08-17 LAB — POTASSIUM: Potassium: 4 mmol/L (ref 3.5–5.1)

## 2020-08-17 MED ORDER — DOXYCYCLINE HYCLATE 100 MG PO TABS
100.0000 mg | ORAL_TABLET | Freq: Once | ORAL | Status: AC
  Administered 2020-08-17: 100 mg via ORAL
  Filled 2020-08-17: qty 1

## 2020-08-17 MED ORDER — ONDANSETRON HCL 4 MG/2ML IJ SOLN
4.0000 mg | Freq: Once | INTRAMUSCULAR | Status: AC
  Administered 2020-08-17: 4 mg via INTRAVENOUS
  Filled 2020-08-17: qty 2

## 2020-08-17 MED ORDER — IOHEXOL 9 MG/ML PO SOLN
ORAL | Status: AC
  Filled 2020-08-17: qty 500

## 2020-08-17 MED ORDER — SODIUM CHLORIDE 0.9 % IV BOLUS
500.0000 mL | Freq: Once | INTRAVENOUS | Status: AC
  Administered 2020-08-17: 500 mL via INTRAVENOUS

## 2020-08-17 MED ORDER — ACETAMINOPHEN 325 MG PO TABS
650.0000 mg | ORAL_TABLET | Freq: Once | ORAL | Status: AC
  Administered 2020-08-17: 650 mg via ORAL
  Filled 2020-08-17: qty 2

## 2020-08-17 MED ORDER — SODIUM CHLORIDE 0.9 % IV BOLUS
1000.0000 mL | Freq: Once | INTRAVENOUS | Status: AC
  Administered 2020-08-17: 1000 mL via INTRAVENOUS

## 2020-08-17 MED ORDER — IOHEXOL 9 MG/ML PO SOLN: 500.0000 mL | ORAL | Status: AC

## 2020-08-17 MED ORDER — IOHEXOL 300 MG/ML  SOLN
100.0000 mL | Freq: Once | INTRAMUSCULAR | Status: AC | PRN
  Administered 2020-08-17: 100 mL via INTRAVENOUS

## 2020-08-17 MED ORDER — DOXYCYCLINE HYCLATE 100 MG PO CAPS: 100.0000 mg | ORAL_CAPSULE | Freq: Two times a day (BID) | ORAL | 0 refills | Status: AC

## 2020-08-17 NOTE — Progress Notes (Signed)
Pt is currently in the ED.  Pt is resting on stretcher with family at bedside.  Pt states that her SOB and CP came on "all of a sudden yesterday".  She was seen earlier in the day in the ER and diagnosed with pneumonia.  After notifying Dr. [name] Campanile, the pt was instructed to come back to the ED for further evaluation.  Pt had a CT scan and is awaiting results.  Pt was able to tolerate drinking the contrast for the CT scan.  Denies any nausea during visit.  Pt has not vomiting since drinking contrast.    Pt states that she been having constipation since surgery.  Pt states she was taking medication for constipation prior to surgery that has not been resumed yet. Pt also states that food "feels like it gets stuck" and she only gets relief when she throws up.  Pt is currently in Phase 3 of post op diet and states that she is not able to tolerate many foods on the list provided by dietician.  Pt was advised to try alkaline by Dr. [name] Campanile, but has not tried any yet to help with nausea and getting fluids in.  Pt states that she can drink some Gatorade Zero, ice chips, frozen fluids without much nausea or vomiting.    Will f/u with pt as needed.

## 2020-08-17 NOTE — ED Provider Notes (Signed)
[facility]-EMERGENCY DEPT Provider Note   CSN: [id number] Arrival date & time: 08/17/20  1420     History No chief complaint on file.   Gloria Kim is a 45 y.o. female history obesity diabetes asthma GERD hypertension dyslipidemia migraines.  Patient seen by me earlier today for evaluation of shortness of breath chest pain and chills, work-up earlier was consistent with a left lower lobe pneumonia.  Patient was discharged on doxycycline.  Shortly after patient discharged I received a call from surgeon Dr. [name] Campanile who advised that given patient's recent gastric bypass this could have caused a leak which would present as a left lower lobe effusion, Dr. [name] Campanile advised to have patient return to the ER for CT abdomen pelvis with limited oral contrast for evaluation.  I then called the patient and her wife and updated them on general surgery's recommendations, patient then returned to the emergency department.  On my evaluation patient is well-appearing no acute distress reports that she is feeling well reports that she had returned home and laid down to watch some TV before receiving the phone call.  She states understanding of care plan and need for CT abdomen pelvis, she has no questions or concerns at this time.  Denies abdominal pain.  HPI     Past Medical History:  Diagnosis Date  . Asthma   . Constipation   . DM (diabetes mellitus) (HCC)    type 2   . Dyslipidemia   . GERD (gastroesophageal reflux disease)   . History of kidney stones   . Hypertension   . Migraines    hx of not recent   . Obesity   . Pneumonia    hx of as a baby   . Right knee pain     Patient Active Problem List   Diagnosis Date Noted  . Migraine headache 07/07/2020  . Asthma 07/07/2020  . GERD (gastroesophageal reflux disease) 07/07/2020  . Chronic pain of right knee 07/07/2020  . S/P gastric bypass 07/07/2020  . Diabetes mellitus with coincident hypertension (HCC) 01/17/2020  .  Other hyperlipidemia 01/17/2020  . Morbid obesity (HCC) 01/17/2020  . Preoperative cardiovascular examination 01/17/2020    Past Surgical History:  Procedure Laterality Date  . BREAT BIOPSY    . CESAREAN SECTION    . GASTRIC ROUX-EN-Y N/A 07/07/2020   Procedure: LAPAROSCOPIC ROUX-EN-Y GASTRIC BYPASS WITH UPPER ENDOSCOPY;  Surgeon: Gaynelle Adu, MD;  Location: [facility] ORS;  Service: General;  Laterality: N/A;  3 HOURS  . MOUTH SURGERY    . PARTIAL HYSTERECTOMY    . right carpal tunnel release     . UPPER GI ENDOSCOPY N/A 07/07/2020   Procedure: UPPER GI ENDOSCOPY;  Surgeon: Gaynelle Adu, MD;  Location: [facility];  Service: General;  Laterality: N/A;     OB History   No obstetric history on file.     Family History  Problem Relation Age of Onset  . Diabetes Mother   . Hypertension Mother   . Kidney disease Mother     Social History   Tobacco Use  . Smoking status: Never Smoker  . Smokeless tobacco: Never Used  Vaping Use  . Vaping Use: Never used  Substance Use Topics  . Alcohol use: Yes    Comment: occas   . Drug use: Never    Home Medications Prior to Admission medications   Medication Sig Start Date End Date Taking? Authorizing Provider  acetaminophen (TYLENOL) 500 MG tablet Take 1,000 mg by mouth  every 6 (six) hours as needed for mild pain.    [provider]  atorvastatin (LIPITOR) 10 MG tablet Take 10 mg by mouth daily.    [provider]  calcium-vitamin D (OSCAL WITH D) 500-200 MG-UNIT tablet Take 1 tablet by mouth in the morning, at noon, and at bedtime.    [provider]  doxycycline (VIBRAMYCIN) 100 MG capsule Take 1 capsule (100 mg total) by mouth 2 (two) times daily for 7 days. 08/17/20 08/24/20  Harlene Salts A, PA-C  gabapentin (NEURONTIN) 100 MG capsule Take 2 capsules (200 mg total) by mouth every 12 (twelve) hours. Patient taking differently: Take 200 mg by mouth every 12 (twelve) hours as needed (pain). 07/09/20   Gaynelle Adu, MD   hydroxypropyl methylcellulose / hypromellose (ISOPTO TEARS / GONIOVISC) 2.5 % ophthalmic solution Place 1 drop into both eyes 3 (three) times daily as needed for dry eyes.    [provider]  Multiple Vitamins-Minerals (BARIATRIC MULTIVITAMINS/IRON PO) Take 1 tablet by mouth daily.    [provider]  ondansetron (ZOFRAN-ODT) 4 MG disintegrating tablet Take 1 tablet (4 mg total) by mouth every 6 (six) hours as needed for nausea or vomiting. 07/09/20   Gaynelle Adu, MD  oxyCODONE (OXY IR/ROXICODONE) 5 MG immediate release tablet Take 1 tablet (5 mg total) by mouth every 6 (six) hours as needed for severe pain. 07/09/20   Gaynelle Adu, MD  pantoprazole (PROTONIX) 40 MG tablet Take 1 tablet (40 mg total) by mouth daily. 07/09/20   Gaynelle Adu, MD  traZODone (DESYREL) 50 MG tablet Take 50 mg by mouth at bedtime as needed for sleep. 04/08/20   [provider]  triamcinolone ointment (KENALOG) 0.1 % Apply 1 application topically daily as needed (eczema). 03/16/20   [provider]    Allergies    Shrimp [shellfish allergy], Nickel, Tramadol, and Latex  Review of Systems   Review of Systems Ten systems are reviewed and are negative for acute change except as noted in the HPI  Physical Exam Updated Vital Signs BP (!) 153/85 (BP Location: Left Arm)   Pulse 100   Temp 98.2 F (36.8 C) (Oral)   Resp 18   SpO2 96%   Physical Exam Constitutional:      General: She is not in acute distress.    Appearance: Normal appearance. She is well-developed. She is not ill-appearing or diaphoretic.  HENT:     Head: Normocephalic and atraumatic.  Eyes:     General: Vision grossly intact. Gaze aligned appropriately.     Pupils: Pupils are equal, round, and reactive to light.  Neck:     Trachea: Trachea and phonation normal.  Pulmonary:     Effort: Pulmonary effort is normal. No respiratory distress.  Abdominal:     General: There is no distension.     Palpations:  Abdomen is soft.     Tenderness: There is no abdominal tenderness. There is no guarding or rebound.  Musculoskeletal:        General: Normal range of motion.     Cervical back: Normal range of motion.  Skin:    General: Skin is warm and dry.  Neurological:     Mental Status: She is alert.     GCS: GCS eye subscore is 4. GCS verbal subscore is 5. GCS motor subscore is 6.     Comments: Speech is clear and goal oriented, follows commands Major Cranial nerves without deficit, no facial droop Moves extremities without ataxia,  coordination intact  Psychiatric:        Behavior: Behavior normal.     ED Results / Procedures / Treatments   Labs (all labs ordered are listed, but only abnormal results are displayed) Labs Reviewed - No data to display  EKG None  Radiology DG Chest 2 View  Result Date: 08/17/2020 CLINICAL DATA:  Chest pain short of breath EXAM: CHEST - 2 VIEW COMPARISON:  01/22/2020 FINDINGS: Mild left lower lobe opacity is new. Possible infiltrate or atelectasis. Small left effusion Right lung clear.  Negative for heart failure. IMPRESSION: Mild left lower lobe airspace disease and small left effusion. Electronically Signed   By: Marlan Palau M.D.   On: 08/17/2020 07:57    Procedures Procedures   Medications Ordered in ED Medications  sodium chloride 0.9 % bolus 500 mL (has no administration in time range)    ED Course  I have reviewed the triage vital signs and the nursing notes.  Pertinent labs & imaging results that were available during my care of the patient were reviewed by me and considered in my medical decision making (see chart for details).    MDM Rules/Calculators/A&P                         Additional history obtained from: 1. Nursing notes from this visit. 2. Review of electronic medical records ------------- 2:25 PM: Patient arrives for CT abdomen pelvis with limited oral contrast.  Labs were obtained earlier today no negation for repeat at this  time, I have asked nursing staff to place an IV for her CT scan.  CT ordered.  I have asked charge nurse to find patient in room to begin work-up.  Patient remains stable no acute distress no abdominal tenderness on examination.  Discussed plan of care with Dr. Estell Harpin. - Care handoff given to Fayrene Helper PA-C at shift change.  Plan of care is to follow-up on CT abdomen pelvis and contact general surgery.   Note: Portions of this report may have been transcribed using voice recognition software. Every effort was made to ensure accuracy; however, inadvertent computerized transcription errors may still be present. Final Clinical Impression(s) / ED Diagnoses Final diagnoses:  None    Rx / DC Orders ED Discharge Orders    None       Elizabeth Palau 08/17/20 1534    Bethann Berkshire, MD 08/18/20 [phone]

## 2020-08-17 NOTE — ED Triage Notes (Signed)
Patient arrives complaining of nausea, shortness of breath, chest pain, chills, and dizziness that started last night. Patient had bariatric surgery on 4/22 and has had issues with dehydration previously. Patient states that she feels some pelvic pressure also.

## 2020-08-17 NOTE — Discharge Instructions (Addendum)
At this time there does not appear to be the presence of an emergent medical condition, however there is always the potential for conditions to change. Please read and follow the below instructions.  Please return to the Emergency Department immediately for any new or worsening symptoms or if your symptoms do not improve within 2 days. Please be sure to follow up with your Primary Care Provider within one week regarding your visit today; please call their office today to schedule an appointment even if you are feeling better for a follow-up visit. Please take your antibiotic Doxycycline as prescribed until complete to help with your symptoms.  Please drink enough water to avoid dehydration and get plenty of rest.  Go to the nearest Emergency Department immediately if: You have fever or chills Your sickness gets worse. This is very serious if: You are an older adult. Your body's defense system is weak. You cough up blood. You have chest pain or trouble breathing. Your home pulse oximeter shows your oxygen level under 90%. You have any new/concerning or worsening of symptoms.   Please read the additional information packets attached to your discharge summary.  Do not take your medicine if  develop an itchy rash, swelling in your mouth or lips, or difficulty breathing; call 911 and seek immediate emergency medical attention if this occurs.  You may review your lab tests and imaging results in their entirety on your MyChart account.  Please discuss all results of fully with your primary care provider and other specialist at your follow-up visit.  Note: Portions of this text may have been transcribed using voice recognition software. Every effort was made to ensure accuracy; however, inadvertent computerized transcription errors may still be present.

## 2020-08-17 NOTE — ED Provider Notes (Signed)
[facility]-EMERGENCY DEPT Provider Note   CSN: [id number] Arrival date & time: 08/17/20  0017     History Chief Complaint  Patient presents with  . Shortness of Breath  . Chills  . Chest Pain    Gloria Kim is a 45 y.o. female history of obesity, diabetes, asthma, GERD, hypertension, dyslipidemia, migraines.   Patient presents today for evaluation of of shortness of breath, patient reports symptom around 2 days ago, she reports symptoms were gradual in onset, mild constant no clear inciting event.  She reports this is associated with some chest pain whenever she coughs, chest pain is burning mild improves with rest.  Patient reports symptoms are associated with chills over the past few days as well.  Patient also reports some urinary hesitancy yesterday which she reports has resolved.  Of note patient reports she did have a laparoscopic bariatric surgery 10 days ago.  Patient denies fever, neck stiffness, sore throat, hemoptysis, abdominal pain, vomiting, diarrhea, extremity swelling/color change, history of blood clot or any additional concerns. HPI     Past Medical History:  Diagnosis Date  . Asthma   . Constipation   . DM (diabetes mellitus) (HCC)    type 2   . Dyslipidemia   . GERD (gastroesophageal reflux disease)   . History of kidney stones   . Hypertension   . Migraines    hx of not recent   . Obesity   . Pneumonia    hx of as a baby   . Right knee pain     Patient Active Problem List   Diagnosis Date Noted  . Migraine headache 07/07/2020  . Asthma 07/07/2020  . GERD (gastroesophageal reflux disease) 07/07/2020  . Chronic pain of right knee 07/07/2020  . S/P gastric bypass 07/07/2020  . Diabetes mellitus with coincident hypertension (HCC) 01/17/2020  . Other hyperlipidemia 01/17/2020  . Morbid obesity (HCC) 01/17/2020  . Preoperative cardiovascular examination 01/17/2020    Past Surgical History:  Procedure Laterality Date  .  BREAT BIOPSY    . CESAREAN SECTION    . GASTRIC ROUX-EN-Y N/A 07/07/2020   Procedure: LAPAROSCOPIC ROUX-EN-Y GASTRIC BYPASS WITH UPPER ENDOSCOPY;  Surgeon: Gaynelle Adu, MD;  Location: [facility];  Service: General;  Laterality: N/A;  3 HOURS  . MOUTH SURGERY    . PARTIAL HYSTERECTOMY    . right carpal tunnel release     . UPPER GI ENDOSCOPY N/A 07/07/2020   Procedure: UPPER GI ENDOSCOPY;  Surgeon: Gaynelle Adu, MD;  Location: [facility];  Service: General;  Laterality: N/A;     OB History   No obstetric history on file.     Family History  Problem Relation Age of Onset  . Diabetes Mother   . Hypertension Mother   . Kidney disease Mother     Social History   Tobacco Use  . Smoking status: Never Smoker  . Smokeless tobacco: Never Used  Vaping Use  . Vaping Use: Never used  Substance Use Topics  . Alcohol use: Yes    Comment: occas   . Drug use: Never    Home Medications Prior to Admission medications   Medication Sig Start Date End Date Taking? Authorizing Provider  acetaminophen (TYLENOL) 500 MG tablet Take 1,000 mg by mouth every 6 (six) hours as needed for mild pain.   Yes [provider]  atorvastatin (LIPITOR) 10 MG tablet Take 10 mg by mouth daily.   Yes [provider]  calcium-vitamin D (Ruthell Rummage WITH  D) 500-200 MG-UNIT tablet Take 1 tablet by mouth in the morning, at noon, and at bedtime.   Yes [provider]  doxycycline (VIBRAMYCIN) 100 MG capsule Take 1 capsule (100 mg total) by mouth 2 (two) times daily for 7 days. 08/17/20 08/24/20 Yes Harlene Salts A, PA-C  gabapentin (NEURONTIN) 100 MG capsule Take 2 capsules (200 mg total) by mouth every 12 (twelve) hours. Patient taking differently: Take 200 mg by mouth every 12 (twelve) hours as needed (pain). 07/09/20  Yes Gaynelle Adu, MD  hydroxypropyl methylcellulose / hypromellose (ISOPTO TEARS / GONIOVISC) 2.5 % ophthalmic solution Place 1 drop into both eyes 3 (three) times daily as needed for dry  eyes.   Yes [provider]  Multiple Vitamins-Minerals (BARIATRIC MULTIVITAMINS/IRON PO) Take 1 tablet by mouth daily.   Yes [provider]  ondansetron (ZOFRAN-ODT) 4 MG disintegrating tablet Take 1 tablet (4 mg total) by mouth every 6 (six) hours as needed for nausea or vomiting. 07/09/20  Yes Gaynelle Adu, MD  oxyCODONE (OXY IR/ROXICODONE) 5 MG immediate release tablet Take 1 tablet (5 mg total) by mouth every 6 (six) hours as needed for severe pain. 07/09/20  Yes Gaynelle Adu, MD  pantoprazole (PROTONIX) 40 MG tablet Take 1 tablet (40 mg total) by mouth daily. 07/09/20  Yes Gaynelle Adu, MD  traZODone (DESYREL) 50 MG tablet Take 50 mg by mouth at bedtime as needed for sleep. 04/08/20  Yes [provider]  triamcinolone ointment (KENALOG) 0.1 % Apply 1 application topically daily as needed (eczema). 03/16/20  Yes [provider]    Allergies    Shrimp [shellfish allergy], Nickel, Tramadol, and Latex  Review of Systems   Review of Systems Ten systems are reviewed and are negative for acute change except as noted in the HPI  Physical Exam Updated Vital Signs BP (!) 145/98   Pulse 81   Temp 99.7 F (37.6 C) (Oral)   Resp 11   Ht 5\' 2"  (1.575 m)   Wt 128.4 kg   SpO2 95%   BMI 51.76 kg/m   Physical Exam Constitutional:      General: She is not in acute distress.    Appearance: Normal appearance. She is well-developed. She is not ill-appearing or diaphoretic.  HENT:     Head: Normocephalic and atraumatic.  Eyes:     General: Vision grossly intact. Gaze aligned appropriately.     Pupils: Pupils are equal, round, and reactive to light.  Neck:     Trachea: Trachea and phonation normal.  Cardiovascular:     Rate and Rhythm: Normal rate and regular rhythm.  Pulmonary:     Effort: Pulmonary effort is normal. No respiratory distress.     Breath sounds: Examination of the left-middle field reveals rhonchi. Examination of the left-lower field reveals  rhonchi. Rhonchi present.  Abdominal:     General: There is no distension.     Palpations: Abdomen is soft.     Tenderness: There is no abdominal tenderness. There is no guarding or rebound.     Comments: Well-healing surgical scars.  Musculoskeletal:        General: Normal range of motion.     Cervical back: Normal range of motion.     Right lower leg: No tenderness. No edema.     Left lower leg: No tenderness. No edema.  Skin:    General: Skin is warm and dry.  Neurological:     Mental Status: She is alert.  GCS: GCS eye subscore is 4. GCS verbal subscore is 5. GCS motor subscore is 6.     Comments: Speech is clear and goal oriented, follows commands Major Cranial nerves without deficit, no facial droop Moves extremities without ataxia, coordination intact  Psychiatric:        Behavior: Behavior normal.     ED Results / Procedures / Treatments   Labs (all labs ordered are listed, but only abnormal results are displayed) Labs Reviewed  BASIC METABOLIC PANEL - Abnormal; Notable for the following components:      Result Value   Potassium 5.5 (*)    Glucose, Bld 121 (*)    All other components within normal limits  CBC - Abnormal; Notable for the following components:   WBC 12.4 (*)    All other components within normal limits  HEPATIC FUNCTION PANEL - Abnormal; Notable for the following components:   Total Bilirubin 1.5 (*)    Bilirubin, Direct 0.5 (*)    Indirect Bilirubin 1.0 (*)    All other components within normal limits  URINALYSIS, ROUTINE W REFLEX MICROSCOPIC - Abnormal; Notable for the following components:   pH 9.0 (*)    All other components within normal limits  CBG MONITORING, ED - Abnormal; Notable for the following components:   Glucose-Capillary 122 (*)    All other components within normal limits  RESP PANEL BY RT-PCR (FLU A&B, COVID) ARPGX2  LIPASE, BLOOD  D-DIMER, QUANTITATIVE  POTASSIUM  I-STAT BETA HCG BLOOD, ED ([facility], [facility], [facility] ONLY)  TROPONIN I  (HIGH SENSITIVITY)  TROPONIN I (HIGH SENSITIVITY)    EKG EKG Interpretation  Date/Time:  Monday Aug 17 2020 06:52:08 EDT Ventricular Rate:  98 PR Interval:  153 QRS Duration: 81 QT Interval:  373 QTC Calculation: 477 R Axis:   69 Text Interpretation: Sinus rhythm Borderline T abnormalities, lateral leads 12 Lead; Mason-Likar Confirmed by Bethann Berkshire [phone]) on 08/17/2020 11:46:02 AM   Radiology DG Chest 2 View  Result Date: 08/17/2020 CLINICAL DATA:  Chest pain short of breath EXAM: CHEST - 2 VIEW COMPARISON:  01/22/2020 FINDINGS: Mild left lower lobe opacity is new. Possible infiltrate or atelectasis. Small left effusion Right lung clear.  Negative for heart failure. IMPRESSION: Mild left lower lobe airspace disease and small left effusion. Electronically Signed   By: Marlan Palau M.D.   On: 08/17/2020 07:57    Procedures Procedures   Medications Ordered in ED Medications  doxycycline (VIBRA-TABS) tablet 100 mg (has no administration in time range)  acetaminophen (TYLENOL) tablet 650 mg (has no administration in time range)  sodium chloride 0.9 % bolus 1,000 mL (1,000 mLs Intravenous New Bag/Given 08/17/20 0823)  ondansetron (ZOFRAN) injection 4 mg (4 mg Intravenous Given 08/17/20 2683)    ED Course  I have reviewed the triage vital signs and the nursing notes.  Pertinent labs & imaging results that were available during my care of the patient were reviewed by me and considered in my medical decision making (see chart for details).    MDM Rules/Calculators/A&P                         Additional history obtained from: 1. Nursing notes from this visit. 2. Review of electronic medical records. ----------------- 45 year old female presents with shortness of breath, chills, chest pain 10 days after bariatric laparoscopic surgery.  Patient seen in triage by provider and labs and orders were placed including CBC, BMP, LFTs, lipase, pregnancy test, troponin,  D-dimer.  On my  evaluation patient endorses some urinary hesitancy so I added a urinalysis.  Patient is on cardiac monitor, vital signs stable. - I reviewed and interpreted labs which include: CBC shows 6 ptosis of 12.4, no anemia or thrombocytopenia. CBG 122. D-dimer within normal limits. LFTs show bilirubin 1.5, AST and ALT normal. BMP shows hyperkalemia 5.5, this was rechecked with a potassium level and showed 4.0, suspect additional abnormalities secondary to hemolysis.  No emergent electrolyte derangement, AKI or gap. Lipase within normal limits. Pregnancy test negative. Urinalysis shows no evidence for infection. COVID/influenza panel negative. High-sensitivity troponin within normal limits x2.  CXR:    IMPRESSION:  Mild left lower lobe airspace disease and small left effusion.   EKG: Sinus rhythm Borderline T abnormalities, lateral leads 12 Lead; Mason-Likar Confirmed by Bethann BerkshireZammit, Joseph [phone]) on 08/17/2020 11:46:02 AM ------------------- Patient was ambulated by nursing staff without difficulty or hypoxia on room air.  I reassessed the patient she was sleeping comfortably in bed no acute distress, easily arousable to voice.  I reviewed labs and imaging with the patient and significant other at bedside and they stated understanding.  Shared decision making made with both patient and her significant other, discussed outpatient treatment and they both elected to proceed.  I discussed strict return percautions with both the patient and her significant other and they state understanding, also advised that they obtain a home pulse oximeter to monitor SPO2 at home.  Will begin patient on doxycycline 100 mg twice daily x7 days for treatment of left lower lobe pneumonia.  Patient appears stable for outpatient treatment of left lower lobe pneumonia at this time, on each reassessment vital signs have been stable without tachycardia, tachypnea or hypoxia on room air.   Additionally low suspicion for pulmonary  embolism, ACS, dissection or other emergent cardiopulmonary etiology of patient's symptoms today given reassuring labs above.  Additionally patient's abdomen is soft nontender without peritoneal signs and incision sites appear well-healing low suspicion for intra-abdominal infection at this time.   Patient seen and evaluated by attending physician Dr. Estell HarpinZammit during this visit who agrees with discharge with Doxycycline and outpatient follow-up at this time.  At this time there does not appear to be any evidence of an acute emergency medical condition and the patient appears stable for discharge with appropriate outpatient follow up. Diagnosis was discussed with patient who verbalizes understanding of care plan and is agreeable to discharge. I have discussed return precautions with patient and family who verbalizes understanding. Patient encouraged to follow-up with their PCP. All questions answered.   Note: Portions of this report may have been transcribed using voice recognition software. Every effort was made to ensure accuracy; however, inadvertent computerized transcription errors may still be present.  Final Clinical Impression(s) / ED Diagnoses Final diagnoses:  Community acquired pneumonia of left lower lobe of lung    Rx / DC Orders ED Discharge Orders         Ordered    doxycycline (VIBRAMYCIN) 100 MG capsule  2 times daily        08/17/20 1218           Elizabeth PalauMorelli, Dasha Kawabata A, PA-C 08/17/20 1228    Bethann BerkshireZammit, Joseph, MD 08/18/20 [phone]

## 2020-08-17 NOTE — ED Triage Notes (Signed)
Patient states she was seen earlier today for c/o nausea, SOB, chest pain, and chills. patient was discharged. Patient states she was called by a PA and told to come back for a CT scan of the abdomen.

## 2020-08-17 NOTE — ED Provider Notes (Signed)
MSE note.  Patient complaining of chest pain shortness of breath some abdominal discomfort.  She has history of recent gastric bypass.  Labs and x-rays ordered.  Physical exam patient having minimal epigastric discomfort and tenderness.  She is not moderate distress   Gloria Berkshire, MD 08/17/20 [phone]

## 2020-08-17 NOTE — Discharge Instructions (Signed)
Fortunately CT scan did not show any complication related to recent surgery.  Please take doxycycline recently prescribed as treatment for lung infection.  Follow-up with your doctor for further care, return if you have any concern.

## 2020-08-17 NOTE — ED Notes (Signed)
Ambulated pt in hallway, O2 remained between 93 and 96.

## 2020-08-17 NOTE — ED Provider Notes (Signed)
Need CT abd/pelvis with limited oral contrast, then contacting general surgery due to pt having recent gastric bypass surgery 10 days ago.  Will consult Dr. [name] Campanile once CT resulted.    6:11 PM Abdominal pelvis CT scan showed no evidence of postoperative complication.  Evidence of left lower Pneumonia was present.  Patient will start taking doxycycline as previously prescribed.  General surgery is aware and agrees with plan.  She is tolerating p.o. and stable for discharge.  BP (!) 145/95 (BP Location: Right Arm)   Pulse 89   Temp 98.5 F (36.9 C) (Oral)   Resp 17   Ht 5\' 2"  (1.575 m)   Wt 128.4 kg   LMP 07/17/2020   SpO2 100%   BMI 51.76 kg/m   Results for orders placed or performed during the hospital encounter of 08/17/20  Resp Panel by RT-PCR (Flu A&B, Covid) Nasopharyngeal Swab   Specimen: Nasopharyngeal Swab; Nasopharyngeal(NP) swabs in vial transport medium  Result Value Ref Range   SARS Coronavirus 2 by RT PCR NEGATIVE NEGATIVE   Influenza A by PCR NEGATIVE NEGATIVE   Influenza B by PCR NEGATIVE NEGATIVE  Basic metabolic panel  Result Value Ref Range   Sodium 143 135 - 145 mmol/L   Potassium 5.5 (H) 3.5 - 5.1 mmol/L   Chloride 109 98 - 111 mmol/L   CO2 24 22 - 32 mmol/L   Glucose, Bld 121 (H) 70 - 99 mg/dL   BUN 7 6 - 20 mg/dL   Creatinine, Ser 10/17/20 0.44 - 1.00 mg/dL   Calcium 9.1 8.9 - 8.14 mg/dL   GFR, Estimated 48.1 >85 mL/min   Anion gap 10 5 - 15  CBC  Result Value Ref Range   WBC 12.4 (H) 4.0 - 10.5 K/uL   RBC 4.35 3.87 - 5.11 MIL/uL   Hemoglobin 12.5 12.0 - 15.0 g/dL   HCT >63 14.9 - 70.2 %   MCV 94.7 80.0 - 100.0 fL   MCH 28.7 26.0 - 34.0 pg   MCHC 30.3 30.0 - 36.0 g/dL   RDW 63.7 85.8 - 85.0 %   Platelets 239 150 - 400 K/uL   nRBC 0.0 0.0 - 0.2 %  Hepatic function panel  Result Value Ref Range   Total Protein 7.5 6.5 - 8.1 g/dL   Albumin 3.7 3.5 - 5.0 g/dL   AST 41 15 - 41 U/L   ALT 19 0 - 44 U/L   Alkaline Phosphatase 65 38 - 126 U/L   Total  Bilirubin 1.5 (H) 0.3 - 1.2 mg/dL   Bilirubin, Direct 0.5 (H) 0.0 - 0.2 mg/dL   Indirect Bilirubin 1.0 (H) 0.3 - 0.9 mg/dL  Lipase, blood  Result Value Ref Range   Lipase 20 11 - 51 U/L  D-dimer, quantitative  Result Value Ref Range   D-Dimer, Quant 0.45 0.00 - 0.50 ug/mL-FEU  Urinalysis, Routine w reflex microscopic Urine, Clean Catch  Result Value Ref Range   Color, Urine YELLOW YELLOW   APPearance CLEAR CLEAR   Specific Gravity, Urine 1.016 1.005 - 1.030   pH 9.0 (H) 5.0 - 8.0   Glucose, UA NEGATIVE NEGATIVE mg/dL   Hgb urine dipstick NEGATIVE NEGATIVE   Bilirubin Urine NEGATIVE NEGATIVE   Ketones, ur NEGATIVE NEGATIVE mg/dL   Protein, ur NEGATIVE NEGATIVE mg/dL   Nitrite NEGATIVE NEGATIVE   Leukocytes,Ua NEGATIVE NEGATIVE  Potassium  Result Value Ref Range   Potassium 4.0 3.5 - 5.1 mmol/L  CBG monitoring, ED  Result Value Ref Range  Glucose-Capillary 122 (H) 70 - 99 mg/dL   Comment 1 Notify RN    Comment 2 Document in Chart   I-Stat beta hCG blood, ED  Result Value Ref Range   I-stat hCG, quantitative <5.0 <5 mIU/mL   Comment 3          Troponin I (High Sensitivity)  Result Value Ref Range   Troponin I (High Sensitivity) <2 <18 ng/L  Troponin I (High Sensitivity)  Result Value Ref Range   Troponin I (High Sensitivity) 2 <18 ng/L   DG Chest 2 View  Result Date: 08/17/2020 CLINICAL DATA:  Chest pain short of breath EXAM: CHEST - 2 VIEW COMPARISON:  01/22/2020 FINDINGS: Mild left lower lobe opacity is new. Possible infiltrate or atelectasis. Small left effusion Right lung clear.  Negative for heart failure. IMPRESSION: Mild left lower lobe airspace disease and small left effusion. Electronically Signed   By: Marlan Palau M.D.   On: 08/17/2020 07:57   CT ABDOMEN PELVIS W CONTRAST  Result Date: 08/17/2020 CLINICAL DATA:  Abdominal pain, fever, postoperative recent gastric bypass EXAM: CT ABDOMEN AND PELVIS WITH CONTRAST TECHNIQUE: Multidetector CT imaging of the abdomen  and pelvis was performed using the standard protocol following bolus administration of intravenous contrast. CONTRAST:  OMNIPAQUE IOHEXOL 300 MG/ML SOLN, additional oral enteric contrast COMPARISON:  CT abdomen pelvis, 09/08/2014 FINDINGS: Lower chest: Dense atelectasis or consolidation of the left lung base (series 6, image 18). Hepatobiliary: No solid liver abnormality is seen. No gallstones, gallbladder wall thickening, or biliary dilatation. Pancreas: Unremarkable. No pancreatic ductal dilatation or surrounding inflammatory changes. Spleen: Normal in size without significant abnormality. Adrenals/Urinary Tract: Adrenal glands are unremarkable. Kidneys are normal, without renal calculi, solid lesion, or hydronephrosis. Bladder is unremarkable. 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 Appendix appears normal. No evidence of bowel wall thickening, distention, or inflammatory changes. Vascular/Lymphatic: No significant vascular findings are present. No enlarged abdominal or pelvic lymph nodes. Reproductive: No mass or other significant abnormality. Ovarian cysts and follicles. Other: No abdominal wall hernia or abnormality. No abdominopelvic ascites. Musculoskeletal: No acute or significant osseous findings. IMPRESSION: 1. 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 No evidence of postoperative complication such as fluid collection. 2. Dense atelectasis or consolidation of the left lung base, concerning for infection or aspiration in the setting of postoperative fever. Electronically Signed   By: Lauralyn Primes M.D.   On: 08/17/2020 17:52      Fayrene Helper, PA-C 08/17/20 1814    Benjiman Core, MD 08/18/20 [phone]

## 2020-09-03 ENCOUNTER — Encounter: Payer: BC Managed Care – PPO | Attending: General Surgery | Admitting: Skilled Nursing Facility1

## 2020-09-03 ENCOUNTER — Other Ambulatory Visit: Payer: Self-pay

## 2020-09-03 DIAGNOSIS — I10 Essential (primary) hypertension: Secondary | ICD-10-CM | POA: Insufficient documentation

## 2020-09-03 DIAGNOSIS — E119 Type 2 diabetes mellitus without complications: Secondary | ICD-10-CM | POA: Insufficient documentation

## 2020-09-03 NOTE — Progress Notes (Signed)
Bariatric Nutrition Follow-Up Visit Medical Nutrition Therapy   NUTRITION ASSESSMENT    Anthropometrics   Surgery date: 07/07/2020 Surgery type: RYGB Start weight at [facility]: 309.8 Weight today: 276.6 pounds   Body Composition Scale 07/22/2020 09/03/2020  Current Body Weight 289.6 276.6  Total Body Fat % 49.5 48.6  Visceral Fat 21   Fat-Free Mass % 50.4    Total Body Water % 39.7 40.1  Muscle-Mass lbs 30.8   BMI 52.9   Body Fat Displacement           Torso  lbs 89          Left Leg  lbs 17.8          Right Leg  lbs 17.8          Left Arm  lbs 8.9          Right Arm   lbs 8.9      Lifestyle & Dietary Hx  Pt states she notices if her drinks are icy she can get them down better so now freezes or heats all her drinks. Pt states she was put on linzess due to constipation even with mirilax. Pt states she has pain on her left side advised it is possible constipation from the PA.  Pt states she thinks eggs might cause her problems.  Pt arrives having already added in non starchy vegetables. Pt states she also tried potatoes stating she felt she was not eating enough and wanted to eat it.  Pt states she sets alarms for her supplements.  Pt states she was looking for meal plans online and when to eat. Pt states she keeps a thought journal. Pt states she weighs daily: Dietitian and pt conversed about daily weighing along with facebook possibly getting in her head a little too much allowing her to only focus on weight and diet mentality which is not helpful staying with the dietary phases nor long term success.  Estimated daily fluid intake: unknown oz Estimated daily protein intake: 50 g Supplements: multi and calcium  Current average weekly physical activity: ADL's  24-Hr Dietary Recall  First Meal: 1 boiled egg Snack:  sugar free jello or pudding Second Meal: baked fish  Snack:  Greens  Third Meal: baked chicken  Snack: sugar free popcicle  Beverages: deaf coffee + protein shake  (just enough to make a latte),  Cold water  Post-Op Goals/ Signs/ Symptoms Using straws: no Drinking while eating: no Chewing/swallowing difficulties: no Changes in vision: no Changes to mood/headaches: no Hair loss/changes to skin/nails: no Difficulty focusing/concentrating: no Sweating: no Dizziness/lightheadedness: no Palpitations: no  Carbonated/caffeinated beverages: no N/V/D/C/Gas: no Abdominal pain: no Dumping syndrome: no    NUTRITION DIAGNOSIS  Overweight/obesity ([place]-3.3) related to past poor dietary habits and physical inactivity as evidenced by completed bariatric surgery and following dietary guidelines for continued weight loss and healthy nutrition status.     NUTRITION INTERVENTION Nutrition counseling (C-1) and education (E-2) to facilitate bariatric surgery goals, including: . Diet advancement to the next phase (phase 4) now including non starchy vegetables . The importance of consuming adequate calories as well as certain nutrients daily due to the body's need for essential vitamins, minerals, and fats . The importance of daily physical activity and to reach a goal of at least 150 minutes of moderate to vigorous physical activity weekly (or as directed by their physician) due to benefits such as increased musculature and improved lab values . The importance of intuitive eating specifically learning hunger-satiety  cues and understanding the importance of learning a new body: The importance of mindful eating to avoid grazing behaviors  . Importance of following phases within the context of future dietary behaviors   Goals: -Continue to aim for a minimum of 64 fluid ounces 7 days a week with at least 30 ounces being plain water -Eat non-starchy vegetables 2 times a day 7 days a week -Start out with soft cooked vegetables today and tomorrow; if tolerated begin to eat raw vegetables or cooked including salads -Eat your 3 ounces of protein first then start in on your  non-starchy vegetables; once you understand how much of your meal leads to satisfaction and not full while still eating 3 ounces of protein and non-starchy vegetables you can eat them in any order  -Continue to aim for 30 minutes of activity at least 5 times a week -Do NOT cook with/add to your food: alfredo sauce, cheese sauce, barbeque sauce, ketchup, fat back, butter, bacon grease, grease, Crisco, OR SUGAR -Have a  Dance party 3-4 days a  Week 30-45 minutes per day -get rid of that scale -go to support group tonight   Handouts Provided Include   Phase 4  Learning Style & Readiness for Change Teaching method utilized: Visual & Auditory  Demonstrated degree of understanding via: Teach Back  Readiness Level: action Barriers to learning/adherence to lifestyle change: diet mentality/weight focused   RD's Notes for Next Visit . Assess adherence to pt chosen goals   MONITORING & EVALUATION Dietary intake, weekly physical activity, body weight  Next Steps Patient is to follow-up in 6 weeks

## 2020-10-15 ENCOUNTER — Encounter: Payer: BC Managed Care – PPO | Attending: General Surgery | Admitting: Skilled Nursing Facility1

## 2020-10-15 ENCOUNTER — Other Ambulatory Visit: Payer: Self-pay

## 2020-10-15 DIAGNOSIS — I10 Essential (primary) hypertension: Secondary | ICD-10-CM | POA: Diagnosis present

## 2020-10-15 DIAGNOSIS — E119 Type 2 diabetes mellitus without complications: Secondary | ICD-10-CM | POA: Insufficient documentation

## 2020-10-15 NOTE — Progress Notes (Signed)
Bariatric Nutrition Follow-Up Visit Medical Nutrition Therapy   NUTRITION ASSESSMENT    Anthropometrics   Surgery date: 07/07/2020 Surgery type: RYGB Start weight at [facility]: 309.8 Weight today: 276.6 pounds   Body Composition Scale 07/22/2020 09/03/2020 10/15/2020  Current Body Weight 289.6 276.6 270.7  Total Body Fat % 49.5 48.6 48.2  Visceral Fat 21  19  Fat-Free Mass % 50.4  51.7   Total Body Water % 39.7 40.1 40.3  Muscle-Mass lbs 30.8  30.6  BMI 52.9  49.4  Body Fat Displacement            Torso  lbs 89  80.8         Left Leg  lbs 17.8  16.1         Right Leg  lbs 17.8  16.1         Left Arm  lbs 8.9  8         Right Arm   lbs 8.9  8     Lifestyle & Dietary Hx  Pt states her water intake is a little better but still needs it to be very cold and states the alkaline water id not make it better. Pt state she has difficulty fitting enough fluid throughout the day stating her time gaps in between drinking are  Pt state vegetables and chicken do not sit heavy. Pt states typically it is beverages that sick heavy. Pt states oatmeal and mashed potatoes sit heavy and do not seem to go anywhere.  Pt states she really likes cold stuff.  Pt states she tries to focus on recipes rather than weight.  Pt states she has gotten rid of the scale which she is proud of. Pt states she was not able to incorporate more dancing due to her ankle. Pt states she has an appt to discuss her ankle tomorrow.    Estimated daily fluid intake: unknown oz Estimated daily protein intake: 60 g Supplements: multi and calcium  Current average weekly physical activity: Joined a gym but not gone with consistency due to an ankle issue; walks the dog around the block  24-Hr Dietary Recall  First Meal: 1 boiled egg + protein shake in coffee or oatmeal Snack:   Second Meal: tuna or meat and cheese wrap with lettuce or chicken + carrots Snack: frozen water + flavorings  Third Meal: baked chicken or fish + greens  or beans or carrots  Snack: sugar free popcicle or sugar free pudding Beverages: deaf coffee + protein shake (just enough to make a latte),  Cold water, water + flavorings, almond milk  Post-Op Goals/ Signs/ Symptoms Using straws: no Drinking while eating: no Chewing/swallowing difficulties: no Changes in vision: no Changes to mood/headaches: no Hair loss/changes to skin/nails: no Difficulty focusing/concentrating: no Sweating: no Dizziness/lightheadedness: no Palpitations: no  Carbonated/caffeinated beverages: no N/V/D/C/Gas: started on linzess  Abdominal pain: no Dumping syndrome: no    NUTRITION DIAGNOSIS  Overweight/obesity ([place]-3.3) related to past poor dietary habits and physical inactivity as evidenced by completed bariatric surgery and following dietary guidelines for continued weight loss and healthy nutrition status.     NUTRITION INTERVENTION Nutrition counseling (C-1) and education (E-2) to facilitate bariatric surgery goals, including: Diet advancement to the next phase 5 starchy vegetables The importance of consuming adequate calories as well as certain nutrients daily due to the body's need for essential vitamins, minerals, and fats The importance of daily physical activity and to reach a goal of at least 150 minutes of moderate  to vigorous physical activity weekly (or as directed by their physician) due to benefits such as increased musculature and improved lab values The importance of intuitive eating specifically learning hunger-satiety cues and understanding the importance of learning a new body: The importance of mindful eating to avoid grazing behaviors  Importance of following phases within the context of future dietary behaviors   Goals: -high fiber tortilla -potato without butter -peas -corn -increase activity when able   Handouts Provided Include  Phase 4  Learning Style & Readiness for Change Teaching method utilized: Visual & Auditory   Demonstrated degree of understanding via: Teach Back  Readiness Level: action Barriers to learning/adherence to lifestyle change: diet mentality/weight focused   RD's Notes for Next Visit Assess adherence to pt chosen goals   MONITORING & EVALUATION Dietary intake, weekly physical activity, body weight  Next Steps Patient is to follow-up in 6 weeks

## 2020-11-26 ENCOUNTER — Encounter: Payer: BC Managed Care – PPO | Attending: General Surgery | Admitting: Skilled Nursing Facility1

## 2020-11-26 ENCOUNTER — Other Ambulatory Visit: Payer: Self-pay

## 2020-11-26 DIAGNOSIS — E119 Type 2 diabetes mellitus without complications: Secondary | ICD-10-CM | POA: Diagnosis present

## 2020-11-26 DIAGNOSIS — I10 Essential (primary) hypertension: Secondary | ICD-10-CM | POA: Insufficient documentation

## 2020-11-26 NOTE — Progress Notes (Signed)
Bariatric Nutrition Follow-Up Visit Medical Nutrition Therapy   NUTRITION ASSESSMENT    Anthropometrics   Labs: A1C 5.5  Surgery date: 07/07/2020 Surgery type: RYGB Start weight at [facility]: 309.8 Weight today: 269.8 pounds   Body Composition Scale 07/22/2020 09/03/2020 10/15/2020 11/26/2020  Current Body Weight 289.6 276.6 270.7 269.8  Total Body Fat % 49.5 48.6 48.2 48.2  Visceral Fat 21  19 19   Fat-Free Mass % 50.4  51.7 51.7   Total Body Water % 39.7 40.1 40.3 40.3  Muscle-Mass lbs 30.8  30.6 30.4  BMI 52.9  49.4 49.2  Body Fat Displacement             Torso  lbs 89  80.8 80.7         Left Leg  lbs 17.8  16.1 16.1         Right Leg  lbs 17.8  16.1 16.1         Left Arm  lbs 8.9  8 8.0         Right Arm   lbs 8.9  8 8.0     Lifestyle & Dietary Hx   Pt states she is still struggling to get in her fluids.  Pt states she does feel like she may be having some urinary retention.  Pt states she had headaches and some dizziness with dry mouth but not extreme with decent energy.   Pt advised if she worsens to either go to the ED or call her PCP needing to be rehydrated. Dietitian will call pt this coming Monday to check on her status  Estimated daily fluid intake: unknown oz Estimated daily protein intake: 60 g Supplements: multi and calcium  Current average weekly physical activity: Joined a gym but not gone with consistency due to an ankle issue; walks the dog around the block  24-Hr Dietary Recall : continued First Meal: 1 boiled egg + protein shake in coffee or oatmeal Snack:   Second Meal: tuna or meat and cheese wrap with lettuce or chicken + carrots Snack: frozen water + flavorings  Third Meal: baked chicken or fish + greens or beans or carrots  Snack: 1-2 sugar free popcicle or sugar free pudding Beverages: 16 oz deaf coffee + protein shake (just enough to make a latte),  couple sips Cold water, 1-2 popcicle   Post-Op Goals/ Signs/ Symptoms Using straws:  no Drinking while eating: no Chewing/swallowing difficulties: no Changes in vision: no Changes to mood/headaches: no Hair loss/changes to skin/nails: no Difficulty focusing/concentrating: no Sweating: no Dizziness/lightheadedness: no Palpitations: no  Carbonated/caffeinated beverages: no N/V/D/C/Gas: started on linzess  Abdominal pain: no Dumping syndrome: no    NUTRITION DIAGNOSIS  Overweight/obesity ([place]-3.3) related to past poor dietary habits and physical inactivity as evidenced by completed bariatric surgery and following dietary guidelines for continued weight loss and healthy nutrition status.     NUTRITION INTERVENTION Nutrition counseling (C-1) and education (E-2) to facilitate bariatric surgery goals, including: The importance of consuming adequate calories as well as certain nutrients daily due to the body's need for essential vitamins, minerals, and fats The importance of daily physical activity and to reach a goal of at least 150 minutes of moderate to vigorous physical activity weekly (or as directed by their physician) due to benefits such as increased musculature and improved lab values The importance of intuitive eating specifically learning hunger-satiety cues and understanding the importance of learning a new body: The importance of mindful eating to avoid grazing behaviors  Importance of  following phases within the context of future dietary behaviors   Goals:  Drink 30 ounces of broth or warm water or hot tea  Handouts Provided Include    Learning Style & Readiness for Change Teaching method utilized: Visual & Auditory  Demonstrated degree of understanding via: Teach Back  Readiness Level: action Barriers to learning/adherence to lifestyle change: diet mentality/weight focused   RD's Notes for Next Visit Assess adherence to pt chosen goals   MONITORING & EVALUATION Dietary intake, weekly physical activity, body weight  Next Steps Patient is to  follow-up dietitian to call pt this coming Monday

## 2020-11-30 ENCOUNTER — Telehealth: Payer: Self-pay | Admitting: Skilled Nursing Facility1

## 2020-11-30 NOTE — Telephone Encounter (Signed)
Called pt to check on fluid status.    Pt states things have been going better with trying hot tea and broth so she is feeling good.

## 2021-09-11 ENCOUNTER — Encounter (HOSPITAL_COMMUNITY): Payer: Self-pay | Admitting: Emergency Medicine

## 2021-09-11 ENCOUNTER — Emergency Department (HOSPITAL_COMMUNITY)
Admission: EM | Admit: 2021-09-11 | Discharge: 2021-09-11 | Disposition: A | Discharge status: Home / Self Care | Payer: BC Managed Care – PPO | Attending: Emergency Medicine | Admitting: Emergency Medicine

## 2021-09-11 ENCOUNTER — Other Ambulatory Visit: Payer: Self-pay

## 2021-09-11 ENCOUNTER — Emergency Department (HOSPITAL_COMMUNITY): Payer: BC Managed Care – PPO

## 2021-09-11 DIAGNOSIS — J189 Pneumonia, unspecified organism: Secondary | ICD-10-CM

## 2021-09-11 DIAGNOSIS — Z79899 Other long term (current) drug therapy: Secondary | ICD-10-CM | POA: Diagnosis not present

## 2021-09-11 DIAGNOSIS — I10 Essential (primary) hypertension: Secondary | ICD-10-CM | POA: Diagnosis not present

## 2021-09-11 DIAGNOSIS — Z7951 Long term (current) use of inhaled steroids: Secondary | ICD-10-CM | POA: Insufficient documentation

## 2021-09-11 DIAGNOSIS — R0682 Tachypnea, not elsewhere classified: Secondary | ICD-10-CM | POA: Diagnosis not present

## 2021-09-11 DIAGNOSIS — J45909 Unspecified asthma, uncomplicated: Secondary | ICD-10-CM | POA: Diagnosis not present

## 2021-09-11 DIAGNOSIS — R0602 Shortness of breath: Secondary | ICD-10-CM | POA: Diagnosis present

## 2021-09-11 DIAGNOSIS — Z91040 Latex allergy status: Secondary | ICD-10-CM | POA: Diagnosis not present

## 2021-09-11 DIAGNOSIS — Z20822 Contact with and (suspected) exposure to covid-19: Secondary | ICD-10-CM | POA: Insufficient documentation

## 2021-09-11 DIAGNOSIS — R000 Tachycardia, unspecified: Secondary | ICD-10-CM | POA: Insufficient documentation

## 2021-09-11 DIAGNOSIS — J181 Lobar pneumonia, unspecified organism: Secondary | ICD-10-CM | POA: Insufficient documentation

## 2021-09-11 DIAGNOSIS — E119 Type 2 diabetes mellitus without complications: Secondary | ICD-10-CM | POA: Diagnosis not present

## 2021-09-11 LAB — I-STAT BETA HCG BLOOD, ED (MC, WL, AP ONLY): I-stat hCG, quantitative: <5 m[IU]/mL (ref <5)

## 2021-09-11 LAB — CBC
HCT: 39.5 % (ref 36.0–46.0)
Hemoglobin: 12.7 g/dL (ref 12.0–15.0)
MCH: 30.5 pg (ref 26.0–34.0)
MCHC: 32.2 g/dL (ref 30.0–36.0)
MCV: 95 fL (ref 80.0–100.0)
Platelets: 243 10*3/uL (ref 150–400)
RBC: 4.16 MIL/uL (ref 3.87–5.11)
RDW: 14.2 % (ref 11.5–15.5)
WBC: 9.4 10*3/uL (ref 4.0–10.5)
nRBC: 0 % (ref 0.0–0.2)

## 2021-09-11 LAB — BASIC METABOLIC PANEL
Anion gap: 10 (ref 5–15)
BUN: 10 mg/dL (ref 6–20)
CO2: 22 mmol/L (ref 22–32)
Calcium: 8.5 mg/dL — ABNORMAL LOW (ref 8.9–10.3)
Chloride: 103 mmol/L (ref 98–111)
Creatinine, Ser: 0.86 mg/dL (ref 0.44–1.00)
GFR, Estimated: >60 mL/min (ref >60)
Glucose, Bld: 120 mg/dL — ABNORMAL HIGH (ref 70–99)
Potassium: 3.8 mmol/L (ref 3.5–5.1)
Sodium: 135 mmol/L (ref 135–145)

## 2021-09-11 LAB — TROPONIN I (HIGH SENSITIVITY)
Troponin I (High Sensitivity): 5 ng/L (ref <18)
Troponin I (High Sensitivity): 4 ng/L (ref <18)

## 2021-09-11 LAB — SARS CORONAVIRUS 2 BY RT PCR: SARS Coronavirus 2 by RT PCR: NEGATIVE

## 2021-09-11 MED ORDER — SODIUM CHLORIDE 0.9 % IV SOLN
1.0000 g | Freq: Once | INTRAVENOUS | Status: AC
  Administered 2021-09-11: 1 g via INTRAVENOUS
  Filled 2021-09-11: qty 10

## 2021-09-11 MED ORDER — DOXYCYCLINE HYCLATE 100 MG PO CAPS: 100.0000 mg | ORAL_CAPSULE | Freq: Two times a day (BID) | ORAL | 0 refills | Status: AC

## 2021-09-11 MED ORDER — ONDANSETRON 4 MG PO TBDP: 4.0000 mg | ORAL_TABLET | Freq: Three times a day (TID) | ORAL | 0 refills | Status: AC | PRN

## 2021-09-11 MED ORDER — AMOXICILLIN-POT CLAVULANATE 875-125 MG PO TABS: 1.0000 | ORAL_TABLET | Freq: Two times a day (BID) | ORAL | 0 refills | Status: AC

## 2021-09-11 MED ORDER — IPRATROPIUM-ALBUTEROL 0.5-2.5 (3) MG/3ML IN SOLN
3.0000 mL | Freq: Once | RESPIRATORY_TRACT | Status: AC
  Administered 2021-09-11: 3 mL via RESPIRATORY_TRACT
  Filled 2021-09-11: qty 3

## 2021-09-11 MED ORDER — ACETAMINOPHEN 325 MG PO TABS
650.0000 mg | ORAL_TABLET | Freq: Four times a day (QID) | ORAL | Status: DC | PRN
  Administered 2021-09-11: 650 mg via ORAL
  Filled 2021-09-11: qty 2

## 2021-09-11 MED ORDER — NIGHTTIME COUGH 6.25-15 MG/15ML PO LIQD: 15.0000 mL | Freq: Every evening | ORAL | 0 refills | Status: DC | PRN

## 2021-09-11 MED ORDER — ALBUTEROL SULFATE HFA 108 (90 BASE) MCG/ACT IN AERS
2.0000 | INHALATION_SPRAY | RESPIRATORY_TRACT | Status: DC | PRN
  Administered 2021-09-11: 2 via RESPIRATORY_TRACT
  Filled 2021-09-11: qty 6.7

## 2021-09-11 MED ORDER — SODIUM CHLORIDE 0.9 % IV BOLUS
1000.0000 mL | Freq: Once | INTRAVENOUS | Status: AC
  Administered 2021-09-11: 1000 mL via INTRAVENOUS

## 2021-09-11 NOTE — ED Triage Notes (Signed)
Pr reported to ED with c/o shortness of breath, midsternal chest pain, cough and headache x4 days. Pt reports hx of asthma.

## 2021-09-11 NOTE — ED Provider Notes (Signed)
[facility] EMERGENCY DEPARTMENT Provider Note  CSN: [id number] Arrival date & time: 09/11/21 0032  Chief Complaint(s) Shortness of Breath  HPI Gloria Kim is a 46 y.o. female with a past medical history listed below including diabetes, asthma who presents to the emergency department for 2 days of gradually worsening shortness of breath and productive cough.  Positive contacts at home, daughter was admitted to the hospital for bacterial pneumonia.  She was discharged yesterday.  Patient endorsing fevers and chills.  Chest pain only with coughing.  She endorses shortness of breath.  No peripheral edema.  No nausea or vomiting.  No other physical complaints.   Shortness of Breath  Past Medical History Past Medical History:  Diagnosis Date   Asthma    Constipation    DM (diabetes mellitus) (HCC)    type 2    Dyslipidemia    GERD (gastroesophageal reflux disease)    History of kidney stones    Hypertension    Migraines    hx of not recent    Obesity    Pneumonia    hx of as a baby    Right knee pain    Patient Active Problem List   Diagnosis Date Noted   Migraine headache 07/07/2020   Asthma 07/07/2020   GERD (gastroesophageal reflux disease) 07/07/2020   Chronic pain of right knee 07/07/2020   S/P gastric bypass 07/07/2020   Diabetes mellitus with coincident hypertension (HCC) 01/17/2020   Other hyperlipidemia 01/17/2020   Morbid obesity (HCC) 01/17/2020   Preoperative cardiovascular examination 01/17/2020   Home Medication(s) Prior to Admission medications   Medication Sig Start Date End Date Taking? Authorizing Provider  acetaminophen (TYLENOL) 500 MG tablet Take 1,000 mg by mouth every 6 (six) hours as needed for mild pain.   Yes [provider]  albuterol (VENTOLIN HFA) 108 (90 Base) MCG/ACT inhaler Inhale 2 puffs into the lungs every 4 (four) hours as needed. 07/29/21  Yes [provider]  amoxicillin-clavulanate (AUGMENTIN)  875-125 MG tablet Take 1 tablet by mouth every 12 (twelve) hours for 7 days. 09/11/21 09/18/21 Yes Keaira Whitehurst, Amadeo Garnet, MD  calcium-vitamin D (OSCAL WITH D) 500-200 MG-UNIT tablet Take 1 tablet by mouth in the morning, at noon, and at bedtime.   Yes [provider]  doxycycline (VIBRAMYCIN) 100 MG capsule Take 1 capsule (100 mg total) by mouth 2 (two) times daily for 7 days. 09/11/21 09/18/21 Yes Macyn Remmert, Amadeo Garnet, MD  hydroxypropyl methylcellulose / hypromellose (ISOPTO TEARS / GONIOVISC) 2.5 % ophthalmic solution Place 1 drop into both eyes 3 (three) times daily as needed for dry eyes.   Yes [provider]  LINZESS 145 MCG CAPS capsule Take 145 mcg by mouth daily. 07/12/21  Yes [provider]  Multiple Vitamins-Minerals (BARIATRIC MULTIVITAMINS/IRON PO) Take 1 tablet by mouth daily.   Yes [provider]  ondansetron (ZOFRAN-ODT) 4 MG disintegrating tablet Take 1 tablet (4 mg total) by mouth every 8 (eight) hours as needed for up to 3 days for nausea or vomiting. 09/11/21 09/14/21 Yes Mateusz Neilan, Amadeo Garnet, MD  pantoprazole (PROTONIX) 40 MG tablet Take 1 tablet (40 mg total) by mouth daily. 07/09/20  Yes Gaynelle Adu, MD  traZODone (DESYREL) 50 MG tablet Take 50 mg by mouth at bedtime as needed for sleep. 04/08/20  Yes [provider]  triamcinolone ointment (KENALOG) 0.1 % Apply 1 application topically daily as needed (eczema). 03/16/20  Yes [provider]  Doxylamine-DM (NIGHTTIME COUGH) 6.25-15 MG/15ML LIQD  Take 15-30 mLs by mouth at bedtime as needed (cough/sleep). 09/11/21   Abbigal Radich, Amadeo GarnetPedro Eduardo, MD  oxyCODONE (OXY IR/ROXICODONE) 5 MG immediate release tablet Take 1 tablet (5 mg total) by mouth every 6 (six) hours as needed for severe pain. Patient not taking: Reported on 09/11/2021 07/09/20   Gaynelle AduWilson, Eric, MD                                                                                                                                     Allergies Shrimp [shellfish allergy], Nickel, Tramadol, and Latex  Review of Systems Review of Systems  Respiratory:  Positive for shortness of breath.   As noted in HPI  Physical Exam Vital Signs  I have reviewed the triage vital signs BP 104/61   Pulse 92   Temp 100.2 F (37.9 C) (Oral)   Resp (!) 26   Ht 5\' 2"  (1.575 m)   Wt 121.6 kg   SpO2 95%   BMI 49.02 kg/m   Physical Exam Vitals reviewed.  Constitutional:      General: She is not in acute distress.    Appearance: She is well-developed. She is not diaphoretic.  HENT:     Head: Normocephalic and atraumatic.     Nose: Nose normal.  Eyes:     General: No scleral icterus.       Right eye: No discharge.        Left eye: No discharge.     Conjunctiva/sclera: Conjunctivae normal.     Pupils: Pupils are equal, round, and reactive to light.  Cardiovascular:     Rate and Rhythm: Regular rhythm. Tachycardia present.     Heart sounds: No murmur heard.   No friction rub. No gallop.  Pulmonary:     Effort: Pulmonary effort is normal. Tachypnea present. No respiratory distress.     Breath sounds: No stridor. Examination of the right-upper field reveals rales. Examination of the right-middle field reveals rales. Rales present. No wheezing.  Abdominal:     General: There is no distension.     Palpations: Abdomen is soft.     Tenderness: There is no abdominal tenderness.  Musculoskeletal:        General: No tenderness.     Cervical back: Normal range of motion and neck supple.  Skin:    General: Skin is warm and dry.     Findings: No erythema or rash.  Neurological:     Mental Status: She is alert and oriented to person, place, and time.    ED Results and Treatments Labs (all labs ordered are listed, but only abnormal results are displayed) Labs Reviewed  BASIC METABOLIC PANEL - Abnormal; Notable for the following components:      Result Value   Glucose, Bld 120 (*)    Calcium 8.5 (*)    All other components  within normal limits  SARS CORONAVIRUS 2 BY RT PCR  CBC  I-STAT BETA HCG BLOOD, ED ([facility], [facility], [facility] ONLY)  TROPONIN I (HIGH SENSITIVITY)  TROPONIN I (HIGH SENSITIVITY)                                                                                                                         EKG  EKG Interpretation  Date/Time:  Saturday Sep 11 2021 03:28:45 EDT Ventricular Rate:  108 PR Interval:  138 QRS Duration: 120 QT Interval:  297 QTC Calculation: 398 R Axis:   71 Text Interpretation: Sinus tachycardia Ventricular premature complex Nonspecific intraventricular conduction delay Borderline T abnormalities, diffuse leads No acute changes Confirmed by Drema Pry [id number]) on 09/11/2021 3:49:54 AM       Radiology DG Chest 2 View  Result Date: 09/11/2021 CLINICAL DATA:  Fever, cough, shortness of breath EXAM: CHEST - 2 VIEW COMPARISON:  08/17/2020 FINDINGS: Patchy/nodular opacity in the right upper lobe, likely reflecting mild infection/pneumonia in this clinical context. Left lung is clear. No pleural effusion or pneumothorax. The heart is normal in size. Visualized osseous structures are within normal limits. IMPRESSION: Patchy/nodular opacity in the right upper lobe, likely reflecting mild infection/pneumonia in this clinical context. However, follow-up chest radiographs are suggested in 4-6 weeks to document clearance. Electronically Signed   By: Charline Bills M.D.   On: 09/11/2021 01:56    Pertinent labs & imaging results that were available during my care of the patient were reviewed by me and considered in my medical decision making (see MDM for details).  Medications Ordered in ED Medications  albuterol (VENTOLIN HFA) 108 (90 Base) MCG/ACT inhaler 2 puff (2 puffs Inhalation Given 09/11/21 0047)  acetaminophen (TYLENOL) tablet 650 mg (650 mg Oral Given 09/11/21 0305)  sodium chloride 0.9 % bolus 1,000 mL (0 mLs Intravenous Stopped 09/11/21 0434)  cefTRIAXone (ROCEPHIN) 1 g in sodium  chloride 0.9 % 100 mL IVPB (0 g Intravenous Stopped 09/11/21 0329)  ipratropium-albuterol (DUONEB) 0.5-2.5 (3) MG/3ML nebulizer solution 3 mL (3 mLs Nebulization Given 09/11/21 0306)                                                                                                                                     Procedures Procedures  (including critical care time)  Medical Decision Making / ED Course    Complexity of Problem:  Co-morbidities/SDOH that complicate the patient evaluation/care: Noted above in HPI  Patient's presenting problem/concern,  DDX, and MDM listed below: Fever, cough shortness of breath We will need to assess for evidence of pneumonia. We will also rule out pneumothorax. We will treat for asthma exacerbation. Low suspicion for pulmonary embolism.  Hospitalization Considered:  Yes if patient is septic  Initial Intervention:  DuoNeb    Complexity of Data:   Cardiac Monitoring: The patient was maintained on a cardiac monitor.   I personally viewed and interpreted the cardiac monitored which showed an underlying rhythm of sinus tachycardia  Laboratory Tests ordered listed below with my independent interpretation: CBC without leukocytosis or anemia No significant electrolyte derangements or renal sufficiency COVID-negative   Imaging Studies ordered listed below with my independent interpretation: Chest x-ray consistent with right upper lobe pneumonia.  No pneumothorax.     ED Course:    Assessment, Add'l Intervention, and Reassessment: Fever and cough Consistent with community-acquired pneumonia Patient is not clinically septic. Provided with antipyretic, IV fluids, and first dose of antibiotics.  If patient remains hemodynamically stable, I believe she would be appropriate for outpatient management. Remained HDS w/o respiratory distress  Final Clinical Impression(s) / ED Diagnoses Final diagnoses:  Pneumonia of right upper lobe due to  infectious organism   The patient appears reasonably screened and/or stabilized for discharge and I doubt any other medical condition or other [facility] requiring further screening, evaluation, or treatment in the ED at this time prior to discharge. Safe for discharge with strict return precautions.  Disposition: Discharge  Condition: Good  I have discussed the results, Dx and Tx plan with the patient/family who expressed understanding and agree(s) with the plan. Discharge instructions discussed at length. The patient/family was given strict return precautions who verbalized understanding of the instructions. No further questions at time of discharge.    ED Discharge Orders          Ordered    doxycycline (VIBRAMYCIN) 100 MG capsule  2 times daily        09/11/21 0644    amoxicillin-clavulanate (AUGMENTIN) 875-125 MG tablet  Every 12 hours        09/11/21 0644    ondansetron (ZOFRAN-ODT) 4 MG disintegrating tablet  Every 8 hours PRN        09/11/21 0644    Doxylamine-DM (NIGHTTIME COUGH) 6.25-15 MG/15ML LIQD  At bedtime PRN        09/11/21 0644             Follow Up: Primary care provider  Call  to schedule an appointment for close follow up           This chart was dictated using voice recognition software.  Despite best efforts to proofread,  errors can occur which can change the documentation meaning.    Nira Conn, MD 09/11/21 [phone]

## 2021-09-11 NOTE — ED Notes (Signed)
Report handed off to Brittany RN.  ?

## 2022-03-01 ENCOUNTER — Encounter (INDEPENDENT_AMBULATORY_CARE_PROVIDER_SITE_OTHER): Payer: Self-pay

## 2022-05-08 ENCOUNTER — Encounter (INDEPENDENT_AMBULATORY_CARE_PROVIDER_SITE_OTHER): Payer: Self-pay

## 2022-05-09 ENCOUNTER — Encounter (INDEPENDENT_AMBULATORY_CARE_PROVIDER_SITE_OTHER): Payer: Medicaid Other | Admitting: Family Medicine

## 2022-07-25 ENCOUNTER — Ambulatory Visit (INDEPENDENT_AMBULATORY_CARE_PROVIDER_SITE_OTHER): Payer: BC Managed Care – PPO | Admitting: Nurse Practitioner

## 2022-07-25 ENCOUNTER — Encounter: Payer: Self-pay | Admitting: Nurse Practitioner

## 2022-07-25 VITALS — BP 129/82 | HR 69 | Temp 98.1°F | Ht 62.0 in | Wt 281.0 lb

## 2022-07-25 DIAGNOSIS — Z6841 Body Mass Index (BMI) 40.0 and over, adult: Secondary | ICD-10-CM | POA: Diagnosis not present

## 2022-07-25 DIAGNOSIS — E1169 Type 2 diabetes mellitus with other specified complication: Secondary | ICD-10-CM

## 2022-07-25 DIAGNOSIS — I10 Essential (primary) hypertension: Secondary | ICD-10-CM

## 2022-07-25 DIAGNOSIS — E119 Type 2 diabetes mellitus without complications: Secondary | ICD-10-CM

## 2022-07-25 DIAGNOSIS — Z9884 Bariatric surgery status: Secondary | ICD-10-CM

## 2022-07-25 DIAGNOSIS — Z0289 Encounter for other administrative examinations: Secondary | ICD-10-CM

## 2022-07-25 NOTE — Progress Notes (Signed)
Office: [phone]  /  Fax: [phone]   Initial Visit  Gloria Kim was seen in clinic today to evaluate for obesity. She is interested in losing weight to improve overall health and reduce the risk of weight related complications. She presents today to review program treatment options, initial physical assessment, and evaluation.     She was referred by: Specialist  When asked what else they would like to accomplish? She states: Improve energy levels and physical activity, Improve existing medical conditions, Reduce number of medications, Improve quality of life, Improve appearance, Improve self-confidence, and Lose a target amount of weight : 199 lbs  Weight history:   She started gaining weight in her 20s after having her son.  She gained weight over the years.    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 [name] Campanile.  Her highest weight prior to surgery was 340 lbs and her nadir weight after surgery was 264 lbs.  When asked how has your weight affected you? She states: Has affected self-esteem, Relationships, Contributed to medical problems, Contributed to orthopedic problems or mobility issues, Having fatigue, Having poor endurance, Problems with eating patterns, and Problems with depression and or anxiety  Some associated conditions: She has a PMH of DMT2, HTN, migraines, asthma, GERD, chronic knee pain, HLD  Contributing factors: Family history, Stress, and Pregnancy  Weight promoting medications identified: Steroids and Contraceptives or hormonal therapy  Current nutrition plan:  She is aiming to eat more protein and low carbs.   Current level of physical activity: None.  She is very active.  Has a 47 yo and is currently trying to adopt 3 children.   Current or previous pharmacotherapy: Saxenda, Victoza & Metformin  Response to medication:  wasn't able to stay on medications due to shortage    Past medical history includes:    Past Medical History:  Diagnosis Date   Asthma    Constipation    DM (diabetes mellitus)    type 2    Dyslipidemia    GERD (gastroesophageal reflux disease)    History of kidney stones    Hypertension    Migraines    hx of not recent    Obesity    Pneumonia    hx of as a baby    Right knee pain      Objective:   BP 129/82   Pulse 69   Temp 98.1 F (36.7 C)   Ht 5\' 2"  (1.575 m)   Wt 281 lb (127.5 kg)   LMP 06/24/2022   SpO2 98%   BMI 51.40 kg/m  She was weighed on the bioimpedance scale: Body mass index is 51.4 kg/m.  Peak Weight:340 lbs , Body Fat%:54.8, Visceral Fat Rating:20, Weight trend over the last 12 months: Increasing  General:  Alert, oriented and cooperative. Patient is in no acute distress.  Respiratory: Normal respiratory effort, no problems with respiration noted   Gait: able to ambulate independently  Mental Status: Normal mood and affect. Normal behavior. Normal judgment and thought content.   DIAGNOSTIC DATA REVIEWED:  BMET    Component Value Date/Time   NA 135 09/11/2021 0113   K 3.8 09/11/2021 0113   CL 103 09/11/2021 0113   CO2 22 09/11/2021 0113   GLUCOSE 120 (H) 09/11/2021 0113   BUN 10 09/11/2021 0113   CREATININE 0.86 09/11/2021 0113   CALCIUM 8.5 (L) 09/11/2021 0113   GFRNONAA >60 09/11/2021 0113   GFRAA >60 09/08/2014 2006  Lab Results  Component Value Date   HGBA1C 6.3 (H) 06/30/2020   No results found for: "INSULIN" CBC    Component Value Date/Time   WBC 9.4 09/11/2021 0113   RBC 4.16 09/11/2021 0113   HGB 12.7 09/11/2021 0113   HCT 39.5 09/11/2021 0113   PLT 243 09/11/2021 0113   MCV 95.0 09/11/2021 0113   MCH 30.5 09/11/2021 0113   MCHC 32.2 09/11/2021 0113   RDW 14.2 09/11/2021 0113   Iron/TIBC/Ferritin/ %Sat No results found for: "IRON", "TIBC", "FERRITIN", "IRONPCTSAT" Lipid Panel  No results found for: "CHOL", "TRIG", "HDL", "CHOLHDL", "VLDL", "LDLCALC", "LDLDIRECT" Hepatic Function Panel     Component  Value Date/Time   PROT 7.5 08/17/2020 0826   ALBUMIN 3.7 08/17/2020 0826   AST 41 08/17/2020 0826   ALT 19 08/17/2020 0826   ALKPHOS 65 08/17/2020 0826   BILITOT 1.5 (H) 08/17/2020 0826   BILIDIR 0.5 (H) 08/17/2020 0826   IBILI 1.0 (H) 08/17/2020 0826   No results found for: "TSH"   Assessment and Plan:   Diabetes mellitus with coincident hypertension Not currently on meds.  Will monitor.    S/P gastric bypass Will obtain labs needed at next visit.    Morbid obesity  BMI 50.0-59.9, adult        Obesity Treatment / Action Plan:  Patient will work on garnering support from family and friends to begin weight loss journey. Will work on eliminating or reducing the presence of highly palatable, calorie dense foods in the home. Will complete provided nutritional and psychosocial assessment questionnaire before the next appointment. Will be scheduled for indirect calorimetry to determine resting energy expenditure in a fasting state.  This will allow [place] to create a reduced calorie, high-protein meal plan to promote loss of fat mass while preserving muscle mass.  Obesity Education Performed Today:  She was weighed on the bioimpedance scale and results were discussed and documented in the synopsis.  We discussed obesity as a disease and the importance of a more detailed evaluation of all the factors contributing to the disease.  We discussed the importance of long term lifestyle changes which include nutrition, exercise and behavioral modifications as well as the importance of customizing this to her specific health and social needs.  We discussed the benefits of reaching a healthier weight to alleviate the symptoms of existing conditions and reduce the risks of the biomechanical, metabolic and psychological effects of obesity.  Gloria Kim appears to be in the action stage of change and states they are ready to start intensive lifestyle modifications and behavioral  modifications.  30 minutes was spent today on this visit including the above counseling, pre-visit chart review, and post-visit documentation.  Reviewed by clinician on day of visit: allergies, medications, problem list, medical history, surgical history, family history, social history, and previous encounter notes pertinent to obesity diagnosis.    Theodis Sato Keegan Bensch FNP-C

## 2022-08-22 ENCOUNTER — Encounter: Payer: Self-pay | Admitting: Bariatrics

## 2022-08-22 ENCOUNTER — Ambulatory Visit (INDEPENDENT_AMBULATORY_CARE_PROVIDER_SITE_OTHER): Payer: BC Managed Care – PPO | Admitting: Bariatrics

## 2022-08-22 VITALS — BP 120/75 | HR 65 | Temp 97.7°F | Ht 62.0 in | Wt 283.0 lb

## 2022-08-22 DIAGNOSIS — F32A Depression, unspecified: Secondary | ICD-10-CM | POA: Diagnosis not present

## 2022-08-22 DIAGNOSIS — R5383 Other fatigue: Secondary | ICD-10-CM

## 2022-08-22 DIAGNOSIS — R0602 Shortness of breath: Secondary | ICD-10-CM | POA: Diagnosis not present

## 2022-08-22 DIAGNOSIS — I152 Hypertension secondary to endocrine disorders: Secondary | ICD-10-CM | POA: Diagnosis not present

## 2022-08-22 DIAGNOSIS — E1159 Type 2 diabetes mellitus with other circulatory complications: Secondary | ICD-10-CM

## 2022-08-22 DIAGNOSIS — Z6841 Body Mass Index (BMI) 40.0 and over, adult: Secondary | ICD-10-CM

## 2022-08-22 DIAGNOSIS — E119 Type 2 diabetes mellitus without complications: Secondary | ICD-10-CM

## 2022-08-22 DIAGNOSIS — Z0000 Encounter for general adult medical examination without abnormal findings: Secondary | ICD-10-CM

## 2022-08-22 DIAGNOSIS — Z1331 Encounter for screening for depression: Secondary | ICD-10-CM

## 2022-08-22 DIAGNOSIS — Z9884 Bariatric surgery status: Secondary | ICD-10-CM

## 2022-08-23 ENCOUNTER — Encounter (INDEPENDENT_AMBULATORY_CARE_PROVIDER_SITE_OTHER): Payer: Self-pay | Admitting: Bariatrics

## 2022-08-23 DIAGNOSIS — R7303 Prediabetes: Secondary | ICD-10-CM | POA: Insufficient documentation

## 2022-08-23 LAB — VITAMIN D 25 HYDROXY (VIT D DEFICIENCY, FRACTURES): Vit D, 25-Hydroxy: 37.6 ng/mL (ref 30.0–100.0)

## 2022-08-23 LAB — COMPREHENSIVE METABOLIC PANEL
ALT: 15 IU/L (ref 0–32)
AST: 19 IU/L (ref 0–40)
Albumin/Globulin Ratio: 1.4 (ref 1.2–2.2)
Albumin: 4.2 g/dL (ref 3.9–4.9)
Alkaline Phosphatase: 88 IU/L (ref 44–121)
BUN/Creatinine Ratio: 13 (ref 9–23)
BUN: 11 mg/dL (ref 6–24)
Bilirubin Total: 0.7 mg/dL (ref 0.0–1.2)
CO2: 23 mmol/L (ref 20–29)
Calcium: 9.1 mg/dL (ref 8.7–10.2)
Chloride: 99 mmol/L (ref 96–106)
Creatinine, Ser: 0.83 mg/dL (ref 0.57–1.00)
Globulin, Total: 2.9 g/dL (ref 1.5–4.5)
Glucose: 91 mg/dL (ref 70–99)
Potassium: 4.6 mmol/L (ref 3.5–5.2)
Sodium: 136 mmol/L (ref 134–144)
Total Protein: 7.1 g/dL (ref 6.0–8.5)
eGFR: 88 mL/min/{1.73_m2} (ref >59)

## 2022-08-23 LAB — TSH+T4F+T3FREE
Free T4: 0.82 ng/dL (ref 0.82–1.77)
T3, Free: 2.6 pg/mL (ref 2.0–4.4)
TSH: 1.7 u[IU]/mL (ref 0.450–4.500)

## 2022-08-23 LAB — INSULIN, RANDOM: INSULIN: 12.6 u[IU]/mL (ref 2.6–24.9)

## 2022-08-23 LAB — LIPID PANEL WITH LDL/HDL RATIO
Cholesterol, Total: 149 mg/dL (ref 100–199)
HDL: 69 mg/dL (ref >39)
LDL Chol Calc (NIH): 65 mg/dL (ref 0–99)
LDL/HDL Ratio: 0.9 ratio (ref 0.0–3.2)
Triglycerides: 78 mg/dL (ref 0–149)
VLDL Cholesterol Cal: 15 mg/dL (ref 5–40)

## 2022-08-23 LAB — VITAMIN B12: Vitamin B-12: 1545 pg/mL — ABNORMAL HIGH (ref 232–1245)

## 2022-08-23 LAB — HEMOGLOBIN A1C
Est. average glucose Bld gHb Est-mCnc: 120 mg/dL
Hgb A1c MFr Bld: 5.8 % — ABNORMAL HIGH (ref 4.8–5.6)

## 2022-08-23 NOTE — Progress Notes (Unsigned)
Chief Complaint:   OBESITY Candies Sejour (MR# [id number]) is a 47 y.o. female who presents for evaluation and treatment of obesity and related comorbidities. Current BMI is Body mass index is 51.76 kg/m. Arrion has been struggling with her weight for many years and has been unsuccessful in either losing weight, maintaining weight loss, or reaching her healthy weight goal.  Landry Mellow is here for her initial visit.  She met with Irene Limbo, FNP-C on 07/25/2022.  She feels like snacking is her worst issue.  Landry Mellow is currently in the action stage of change and ready to dedicate time achieving and maintaining a healthier weight. Landry Mellow is interested in becoming our patient and working on intensive lifestyle modifications including (but not limited to) diet and exercise for weight loss.  Charlaine's habits were reviewed today and are as follows: Her family eats meals together, she thinks her family will eat healthier with her, she struggles with family and or coworkers weight loss sabotage, her desired weight loss is 93 lbs, she has been heavy most of her life, she started gaining weight after the birth of her first child in 76, her heaviest weight ever was 334 pounds, she has significant food cravings issues, she snacks frequently in the evenings, she wakes up frequently in the middle of the night to eat, she skips meals frequently, she is trying to follow a vegetarian diet, she is frequently drinking liquids with calories, she frequently makes poor food choices, and she struggles with emotional eating.  Depression Screen Tahjanae's Food and Mood (modified PHQ-9) score was 18.  Subjective:   1. Other fatigue Lurlie admits to daytime somnolence and admits to waking up still tired. Patient has a history of symptoms of daytime fatigue, morning fatigue, and morning headache. Annie generally gets 5 or 6 hours of sleep per night, and states that she has nightime awakenings. Snoring is present.  Apneic episodes are present. Epworth Sleepiness Score is 6.   2. SOB (shortness of breath) on exertion Quetzalli notes increasing shortness of breath with exercising and seems to be worsening over time with weight gain. She notes getting out of breath sooner with activity than she used to. This has not gotten worse recently. Anberlin denies shortness of breath at rest or orthopnea.  3. Diabetes mellitus with coincident hypertension (HCC) Terie's last glucose level was 120.  She is not on medications, and has not been after her bariatric surgery.  She took metformin in the past.  4. S/P gastric bypass Shakirah is taking bariatric multivitamins with iron, calcium, and vitamin D.  She is status post gastric surgery in 2022 with Dr. [name] Campanile.  She denies complications.  Her highest weight was 307 pounds and her lowest weight was 264 pounds.  5. Health care maintenance Given obesity.   Assessment/Plan:   1. Other fatigue Caitlan does feel that her weight is causing her energy to be lower than it should be. Fatigue may be related to obesity, depression or many other causes. Labs will be ordered, and in the meanwhile, Letrice will focus on self care including making healthy food choices, increasing physical activity and focusing on stress reduction.  - EKG 12-Lead - TSH+T4F+T3Free  2. SOB (shortness of breath) on exertion Sumaya does feel that she gets out of breath more easily that she used to when she exercises. Angeletta's shortness of breath appears to be obesity related and exercise induced. She has agreed to work on weight loss and gradually increase exercise to treat  her exercise induced shortness of breath. Will continue to monitor closely.  - TSH+T4F+T3Free  3. Diabetes mellitus with coincident hypertension (HCC) We will check labs today, and we will follow-up at her next visit. Shawan will begin her meal plan and exercise.  - Hemoglobin A1c - Insulin, random  4. S/P gastric bypass We will  check labs today, and we will follow-up at her next visit. Esta will continue her multivitamins.  - Hemoglobin A1c - Insulin, random - Lipid Panel With LDL/HDL Ratio - TSH+T4F+T3Free - VITAMIN D 25 Hydroxy (Vit-D Deficiency, Fractures) - Comprehensive metabolic panel - Vitamin B12  5. Health care maintenance We will check labs today. EKG and IC were done today and reviewed with the patient.   - Hemoglobin A1c - Insulin, random - Lipid Panel With LDL/HDL Ratio - TSH+T4F+T3Free - VITAMIN D 25 Hydroxy (Vit-D Deficiency, Fractures) - Comprehensive metabolic panel - Vitamin B12  6. Depression screening Denene had a positive depression screening. Depression is commonly associated with obesity and often results in emotional eating behaviors. We will monitor this closely and work on CBT to help improve the non-hunger eating patterns. Referral to Psychology may be required if no improvement is seen as she continues in our clinic.  7. Morbid obesity (HCC) - Hemoglobin A1c - Insulin, random - Lipid Panel With LDL/HDL Ratio - TSH+T4F+T3Free - VITAMIN D 25 Hydroxy (Vit-D Deficiency, Fractures) - Comprehensive metabolic panel - Vitamin B12  8. BMI 50.0-59.9, adult (HCC) Clotile is currently in the action stage of change and her goal is to continue with weight loss efforts. I recommend Raygen begin the structured treatment plan as follows:  She has agreed to the Category 3 Plan.  Meal planning was discussed.  Review labs with the patient from 09/11/2021, BMP, CBC, and glucose; Review labs from 06/13/2022, CBC, anemia panel, B1, iron, and ferritin.  Exercise goals: No exercise has been prescribed at this time.   Behavioral modification strategies: increasing lean protein intake, decreasing simple carbohydrates, increasing vegetables, increasing water intake, decreasing eating out, no skipping meals, meal planning and cooking strategies, keeping healthy foods in the home, and planning for  success.  She was informed of the importance of frequent follow-up visits to maximize her success with intensive lifestyle modifications for her multiple health conditions. She was informed we would discuss her lab results at her next visit unless there is a critical issue that needs to be addressed sooner. Bobi agreed to keep her next visit at the agreed upon time to discuss these results.  Objective:   Blood pressure 120/75, pulse 65, temperature 97.7 F (36.5 C), height 5\' 2"  (1.575 m), weight 283 lb (128.4 kg), last menstrual period 06/24/2022, SpO2 100 %. Body mass index is 51.76 kg/m.  EKG: Normal sinus rhythm, rate 70 BPM.  Indirect Calorimeter completed today shows a VO2 of 312 and a REE of 2160.  Her calculated basal metabolic rate is 1610 thus her basal metabolic rate is better than expected.  General: Cooperative, alert, well developed, in no acute distress. HEENT: Conjunctivae and lids unremarkable. Cardiovascular: Regular rhythm.  Lungs: Normal work of breathing. Neurologic: No focal deficits.   Lab Results  Component Value Date   CREATININE 0.83 08/22/2022   BUN 11 08/22/2022   NA 136 08/22/2022   K 4.6 08/22/2022   CL 99 08/22/2022   CO2 23 08/22/2022   Lab Results  Component Value Date   ALT 15 08/22/2022   AST 19 08/22/2022   ALKPHOS 88 08/22/2022  BILITOT 0.7 08/22/2022   Lab Results  Component Value Date   HGBA1C 5.8 (H) 08/22/2022   HGBA1C 6.3 (H) 06/30/2020   Lab Results  Component Value Date   INSULIN 12.6 08/22/2022   Lab Results  Component Value Date   TSH 1.700 08/22/2022   Lab Results  Component Value Date   CHOL 149 08/22/2022   HDL 69 08/22/2022   LDLCALC 65 08/22/2022   TRIG 78 08/22/2022   Lab Results  Component Value Date   WBC 9.4 09/11/2021   HGB 12.7 09/11/2021   HCT 39.5 09/11/2021   MCV 95.0 09/11/2021   PLT 243 09/11/2021   No results found for: "IRON", "TIBC", "FERRITIN"  Attestation Statements:   Reviewed by  clinician on day of visit: allergies, medications, problem list, medical history, surgical history, family history, social history, and previous encounter notes.  Time spent on visit including pre-visit chart review and post-visit charting and care was 40 minutes.   Trude Mcburney, am acting as Energy manager for [employer], DO.  I have reviewed the above documentation for accuracy and completeness, and I agree with the above. Corinna Capra, DO

## 2022-08-24 ENCOUNTER — Encounter: Payer: Self-pay | Admitting: Bariatrics

## 2022-09-01 ENCOUNTER — Encounter: Payer: Self-pay | Admitting: Nurse Practitioner

## 2022-09-05 ENCOUNTER — Ambulatory Visit: Payer: BC Managed Care – PPO | Admitting: Nurse Practitioner

## 2022-09-13 ENCOUNTER — Encounter: Payer: Self-pay | Admitting: Nurse Practitioner

## 2022-09-13 ENCOUNTER — Ambulatory Visit (INDEPENDENT_AMBULATORY_CARE_PROVIDER_SITE_OTHER): Payer: BC Managed Care – PPO | Admitting: Nurse Practitioner

## 2022-09-13 VITALS — BP 132/75 | HR 80 | Temp 97.5°F | Ht 62.0 in | Wt 280.0 lb

## 2022-09-13 DIAGNOSIS — R7303 Prediabetes: Secondary | ICD-10-CM

## 2022-09-13 DIAGNOSIS — Z6841 Body Mass Index (BMI) 40.0 and over, adult: Secondary | ICD-10-CM

## 2022-09-13 MED ORDER — METFORMIN HCL 500 MG PO TABS
500.0000 mg | ORAL_TABLET | Freq: Every day | ORAL | 0 refills | Status: DC
Start: 2022-09-13 — End: 2022-10-05

## 2022-09-13 NOTE — Progress Notes (Signed)
Office: [phone]  /  Fax: [phone]  WEIGHT SUMMARY AND BIOMETRICS  Weight Lost Since Last Visit: 3lb  Vitals Temp: (!) 97.5 F (36.4 C) BP: 132/75 Pulse Rate: 80 SpO2: 95 %   Anthropometric Measurements Height: 5\' 2"  (1.575 m) Weight: 280 lb (127 kg) BMI (Calculated): 51.2 Weight at Last Visit: 283lb Weight Lost Since Last Visit: 3lb Starting Weight: 283lb   Body Composition  Body Fat %: 53.5 % Fat Mass (lbs): 150 lbs Muscle Mass (lbs): 123.8 lbs Total Body Water (lbs): 99 lbs Visceral Fat Rating : 19   Other Clinical Data Fasting: no Labs: no Today's Visit #: 2 Starting Date: 08/22/22     HPI  Chief Complaint: OBESITY  Gloria Kim is here to discuss her progress with her obesity treatment plan. She is on the the Category 3 Plan and states she is following her eating plan approximately 50 % of the time. She states she is exercising 0 minutes 0 days per week.   Interval History:  Since last office visit she has lost 3 pounds. She is eating a protein with each meal. She is struggling with eat 3 meals per day.  She is substituting a protein shake for a meal 2-3 days per week.  She is trying to drink enough water.   She also drinks coffee daily.     Pharmacotherapy for weight loss: She is not currently taking medications  for medical weight loss.    Previous pharmacotherapy for medical weight loss: Arville Lime, Victoza & Metformin   Bariatric surgery:  Patient is status post laparoscopic Roux-en-y gastric bypass on 07/07/20 by Dr. [name] Campanile.  Her highest weight prior to surgery was 340 lbs and her nadir weight after surgery was 264 lbs. She is taking a MV with iron once daily, Vit D with calcium 2,000 IU BID and Vit B12 once daily.    Pharmacotherapy for history of DMT2:  She is not currently taking any medications. Resolved after having bariatric surgery.     Last A1c was 5.8 Last eye exam:  2023 She has tried Metformin and something else  in the past.   Information given on Metformin. Denied side effects while taking in the past.   Notes polyphagia and cravings.   Lab Results  Component Value Date   HGBA1C 5.8 (H) 08/22/2022   HGBA1C 6.3 (H) 06/30/2020   Lab Results  Component Value Date   LDLCALC 65 08/22/2022   CREATININE 0.83 08/22/2022    PHYSICAL EXAM:  Blood pressure 132/75, pulse 80, temperature (!) 97.5 F (36.4 C), height 5\' 2"  (1.575 m), weight 280 lb (127 kg), SpO2 95 %. Body mass index is 51.21 kg/m.  General: She is overweight, cooperative, alert, well developed, and in no acute distress. PSYCH: Has normal mood, affect and thought process.   Extremities: No edema.  Neurologic: No gross sensory or motor deficits. No tremors or fasciculations noted.    DIAGNOSTIC DATA REVIEWED:  BMET    Component Value Date/Time   NA 136 08/22/2022 0957   K 4.6 08/22/2022 0957   CL 99 08/22/2022 0957   CO2 23 08/22/2022 0957   GLUCOSE 91 08/22/2022 0957   GLUCOSE 120 (H) 09/11/2021 0113   BUN 11 08/22/2022 0957   CREATININE 0.83 08/22/2022 0957   CALCIUM 9.1 08/22/2022 0957   GFRNONAA >60 09/11/2021 0113   GFRAA >60 09/08/2014 2006   Lab Results  Component Value Date   HGBA1C 5.8 (H) 08/22/2022   HGBA1C 6.3 (H) 06/30/2020  Lab Results  Component Value Date   INSULIN 12.6 08/22/2022   Lab Results  Component Value Date   TSH 1.700 08/22/2022   CBC    Component Value Date/Time   WBC 9.4 09/11/2021 0113   RBC 4.16 09/11/2021 0113   HGB 12.7 09/11/2021 0113   HCT 39.5 09/11/2021 0113   PLT 243 09/11/2021 0113   MCV 95.0 09/11/2021 0113   MCH 30.5 09/11/2021 0113   MCHC 32.2 09/11/2021 0113   RDW 14.2 09/11/2021 0113   Iron Studies No results found for: "IRON", "TIBC", "FERRITIN", "IRONPCTSAT" Lipid Panel     Component Value Date/Time   CHOL 149 08/22/2022 0957   TRIG 78 08/22/2022 0957   HDL 69 08/22/2022 0957   LDLCALC 65 08/22/2022 0957   Hepatic Function Panel     Component Value Date/Time    PROT 7.1 08/22/2022 0957   ALBUMIN 4.2 08/22/2022 0957   AST 19 08/22/2022 0957   ALT 15 08/22/2022 0957   ALKPHOS 88 08/22/2022 0957   BILITOT 0.7 08/22/2022 0957   BILIDIR 0.5 (H) 08/17/2020 0826   IBILI 1.0 (H) 08/17/2020 0826      Component Value Date/Time   TSH 1.700 08/22/2022 0957   Nutritional Lab Results  Component Value Date   VD25OH 37.6 08/22/2022     ASSESSMENT AND PLAN  TREATMENT PLAN FOR OBESITY:  Recommended Dietary Goals  Gloria Kim is currently in the action stage of change. As such, her goal is to continue weight management plan. She has agreed to keeping a food journal and adhering to recommended goals of 1500 calories and 90+ protein.  Behavioral Intervention  We discussed the following Behavioral Modification Strategies today: increasing lean protein intake, decreasing simple carbohydrates , increasing vegetables, increasing lower glycemic fruits, avoiding skipping meals, increasing water intake, continue to practice mindfulness when eating, and planning for success.  Additional resources provided today: NA  Recommended Physical Activity Goals  Gloria Kim has been advised to work up to 150 minutes of moderate intensity aerobic activity a week and strengthening exercises 2-3 times per week for cardiovascular health, weight loss maintenance and preservation of muscle mass.   She has agreed to Think about ways to increase daily physical activity and overcoming barriers to exercise and Increase physical activity in their day and reduce sedentary time (increase NEAT).   ASSOCIATED CONDITIONS ADDRESSED TODAY  Action/Plan  Pre-diabetes -     Start metFORMIN HCl; Take 1 tablet (500 mg total) by mouth daily with breakfast.  Dispense: 30 tablet; Refill: 0.  Side effects discussed  Gloria Kim will continue to work on weight loss, exercise, and decreasing simple carbohydrates to help decrease the risk of diabetes.    Morbid obesity (HCC)  BMI 50.0-59.9, adult  (HCC)       Labs reviewed in chart with patient from 08/22/22. All questions answered   Return in about 3 weeks (around 10/04/2022).Marland Kitchen She was informed of the importance of frequent follow up visits to maximize her success with intensive lifestyle modifications for her multiple health conditions.   ATTESTASTION STATEMENTS:  Reviewed by clinician on day of visit: allergies, medications, problem list, medical history, surgical history, family history, social history, and previous encounter notes.   Time spent on visit including pre-visit chart review and post-visit care and charting was 30 minutes.    Theodis Sato. Rae Plotner FNP-C

## 2022-09-14 ENCOUNTER — Encounter: Payer: Self-pay | Admitting: Nurse Practitioner

## 2022-10-05 ENCOUNTER — Ambulatory Visit (INDEPENDENT_AMBULATORY_CARE_PROVIDER_SITE_OTHER): Payer: BC Managed Care – PPO | Admitting: Nurse Practitioner

## 2022-10-05 ENCOUNTER — Encounter: Payer: Self-pay | Admitting: Nurse Practitioner

## 2022-10-05 VITALS — BP 119/75 | HR 84 | Temp 97.9°F | Ht 62.0 in | Wt 282.0 lb

## 2022-10-05 DIAGNOSIS — R7303 Prediabetes: Secondary | ICD-10-CM | POA: Diagnosis not present

## 2022-10-05 DIAGNOSIS — R7989 Other specified abnormal findings of blood chemistry: Secondary | ICD-10-CM

## 2022-10-05 DIAGNOSIS — Z6841 Body Mass Index (BMI) 40.0 and over, adult: Secondary | ICD-10-CM | POA: Diagnosis not present

## 2022-10-05 MED ORDER — METFORMIN HCL 500 MG PO TABS
500.0000 mg | ORAL_TABLET | Freq: Every day | ORAL | 0 refills | Status: DC
Start: 2022-10-05 — End: 2022-10-27

## 2022-10-05 NOTE — Patient Instructions (Signed)
Steps to starting your start Contrave  The office will send a prior authorization request to your insurance company for approval. We will send you a mychart message once we hear back from your insurance with a decision.  This can take up to 7-10 business days.   Is Contrave an option for me? Do not take Contrave if you:   Have uncontrolled hypertension  Have or have had seizures  Use other medicines that contain bupropion such as Wellbutrin, Wellbutrin SR, Wellbutrin XL, and Aplenzin.  Have or have had an eating disorder called anorexia (eating very little) or bulimia (eating too much and vomiting to avoid gaining weight)  Are dependent on opioid pain medicines or use medicines to help stop taking opioids such as methadone or buprenorphine, or are in opiate withdrawal  Drink a lot of alcohol and abruptly stop drinking, or use medicines called sedatives (these make you sleepy), benzodiazepines, or anti-seizure medicines and you stop using them all of a sudden  Are taking medicines called monoamine oxidase inhibitors (MAOIs). Ask your healthcare provider or pharmacist if you are not sure if you take an MAOI, including linezoid. Do not start Contrave until you have stopped taking your MAOI for at least 14 days.  Are allergic to naltrexone HCl or bupropion HCl or any of the ingredients in Contrave. See the end of this Medication Guide for a complete list of ingredients in Contrave.  Are pregnant or planning to become pregnant. Tell your healthcare provider right away if you become pregnant while taking CONTRAVE  What is Contrave and how does it work?  Contrave is a prescription only medicine to help with your weight loss. It works on an area of the brain that controls your appetite.  It is a combination of two medicines that are extended release: naltrexone HCL and bupropion HCL.  One of the ingredients in Contrave, bupropion, is the same ingredient in some other medicines used to treat  depression and to help people quit smoking. However, Contrave is not approved to treat depression or other mental illnesses, or to help people quit smoking (smoking cessation).   This medicine will be most effective when combined with a reduced calorie diet and physical activity.  How should I take Contrave?  Take exactly as your provider tells you to. It is an increasing dose according to the following chart:  Contrave How should I take Contrave?   It is best to take Contrave with food. However, do not take with high-fat meals.   Swallow the extended-release tablet whole. Do not crush, break, or chew it.   If you miss a dose, skip the missed dose and go back to your regular dosing schedule. Do not take extra medicine to make up for a missed dose.  Do not take more than 2 tablets in the morning and 2 tablets in the evening.  Do not take more than 2 tablets at the same time or more than 4 tablets in 1 day.  Do not stop taking Contrave without talking to your provider.   Stopping Contrave suddenly can cause serious side effects, such as seizures.  Swallow Contrave tablets whole. Do not cut, chew, or crush tablets.  Do not take Contrave when eating a high-fat meal. It may increase your risk of seizures.   What should I avoid while taking Contrave?  You should avoid other medicines that contain bupropion such as Wellbutrin, Wellbutrin SR, Wellbutrin XL and Aplenzin.  You should avoid opioid pain medications or medicines   to help stop taking opioids such as methadone or buprenorphine. There may be a risk of opioid overdose.  Do not drink alcohol while taking Contrave. If you drink alcohol, talk to your provider before starting Contrave.   Avoid eating a high-fat meal at the same time you take Contrave. It may increase your risk of seizures.   Women who can become pregnant: Use effective birth control (contraception) consistently while taking Contrave. If you miss a menstrual period,  STOP Contrave and call our office immediately. Monthly pregnancy tests will be performed at your appointment if indicated.  What side effects may I notice when taking Contrave?  Side effects that usually do not require medical attention (report to our office if they continue or are bothersome): o Nausea o Constipation (you may take over the counter laxative if needed) o Headache o Dry mouth (drink at least 64 oz. of fluid daily) o Trouble sleeping (insomnia) o Vomiting o Dizziness o Diarrhea   Side effects that you should report to our office as soon as possible: o Seizures: DO NOT take Contrave again o Depression or severe changes in mood o Thoughts about suicide or dying o Feeling anxious, agitated, irritable, restless, or nervous o Slowed breathing and/or shallow breathing o Severe drowsiness o Confusion o Signs of allergic reaction such as rash, itching, hives, fever, swollen lymph glands, painful sores in your mouth or around your eyes, swelling of your lips or tongue, chest pain, or trouble breathing o Increased blood pressure o Increased heart rate or palpitations o Stomach pain lasting more than a few days o Dark urine o Yellowing of the whites of your eyes o Severe tiredness o Sudden changes in vision o Swelling or redness in or around the eye  o Low blood sugar in Type 2 Diabetes.   Other important information  While taking CONTRAVE, you or your family members should pay close attention to any changes, especially sudden changes, in mood, behaviors, thoughts, or feelings and maintain communication with your provider. You will be asked to sign an informed consent prior to starting Contrave.  If another healthcare provider prescribes narcotic pain medication for you, please call our office immediately for advice regarding Contrave.  Your prescription will be sent electronically to your pharmacy.   Refills will require an office visit  What is Qsymia and how does it  work?  Qsymia is a prescription only medicine to help with your weight loss. It is a combination of two medicines that are low dose, long-acting: Phentermine & Topiramate. Qsymia contains low dose Phentermine which is a stimulant medicine that could affect your heart rate and blood pressure Qsymia is designed to help you feel satisfied faster that will help you to decrease portion size. Also, it helps curb late night snacking habits. Some food you usually enjoy may start to taste differently which will help you make healthier food choices.  This medicine will be most effective when combined with a reduced calorie diet and physical activity.  How should I take Qsymia? Take daily in the morning with breakfast. Swallow the extended-release capsule whole. Do not crush, break, or chew it.  If you miss a dose, take it as soon as possible. If it is after 12pm, skip the missed dose and go back to your regular dosing schedule. Do not take extra medicine to make up for the missed dose. You have received two separate prescriptions today. You will initially take a lower dose of 3.75mg/23mg for 14 days then increase   to a higher dose of 7.5mg/46mg for maintenance for 30 days. There are 4 total dosing options. Your provider will discuss any need to go up to a higher dose during your office visits.  If you are taking Levothyroxine, take the Levothyroxine 1 hours before breakfast and the take the Qsymia 1 hour after breakfast. Do not stop taking Qsymia without talking to your provider. Stopping Qsymia suddenly can cause serious side effects, such as seizures and headaches.   What should I avoid while taking Qsymia? Limit caffeine to 1 small cup daily. Examples are soda, coffee, tea, herbal tea, energy drinks, and chocolate Avoid decongestant medicines like Sudafed, Mucinex-D, and Zyrtec-D. Qsymia may cause you to feel dizzy, drowsy, or confused, or to have trouble thinking or speaking. Do not drive or do  anything else that could be dangerous until you know how this medicine affects you.  Women who can become pregnant: Use effective birth control (contraception) consistently while taking Qsymia. If you miss a menstrual period, STOP Qsymia and call our office immediately. Pregnancy tests will be performed at your appointment if indicated.  What side effects may I notice when taking Qsymia? Side effects that usually do not require medical attention (report to our office if they continue or are bothersome): Dry mouth (drink at least 64 oz of fluid daily) Constipation (you may take over the counter laxative if needed) Metallic taste in your mouth when drinking carbonated beverages Numbness or tingling in the hands, arms, feet or face that lasts more than a week Headache Sudden changes in vision Mental fuzziness (problems with concentration, attention, memory or speech) Trouble sleeping (insomnia) Side effects that you should report to our office as soon as possible: Increases in heart rate and/or palpitations (feeling like your heart is racing or pounding in your chest that lasts several minutes) Chest pain Increased blood pressure Dizziness or feeling faint Shortness of breath Irritability Feeling anxious, agitated, restless, or nervous Depression or severe changes in mood Problems urinating Unusual swelling of the legs Vomiting   Other important information You will be asked to sign an informed consent prior to starting Qysmia Qsymia is a federally controlled substance. Keep Qsymia in a safe place to prevent misuse and abuse. Selling or giving away Qsymia may harm others, and is against the law.  Your prescription will be sent to the pharmacy during your visit.  Refills will require an office visit. Your insurance may not cover the cost of this medicine. Our office will complete a pre-authorization if required by your insurance.  

## 2022-10-05 NOTE — Progress Notes (Signed)
Office: [phone]  /  Fax: [phone]  WEIGHT SUMMARY AND BIOMETRICS  No data recorded Weight Gained Since Last Visit: 2lb   Vitals Temp: 97.9 F (36.6 C) BP: 119/75 Pulse Rate: 84 SpO2: 100 %   Anthropometric Measurements Height: 5\' 2"  (1.575 m) Weight: 282 lb (127.9 kg) BMI (Calculated): 51.57 Weight at Last Visit: 280lb Weight Gained Since Last Visit: 2lb Starting Weight: 283lb Total Weight Loss (lbs): 1 lb (0.454 kg)   Body Composition  Body Fat %: 54.8 % Fat Mass (lbs): 154.8 lbs Muscle Mass (lbs): 121.2 lbs Total Body Water (lbs): 99.4 lbs Visceral Fat Rating : 20   Other Clinical Data Fasting: No Labs: No Today's Visit #: 3 Starting Date: 08/22/22     HPI  Chief Complaint: OBESITY  Gloria Kim is here to discuss her progress with her obesity treatment plan. She is on the the Category 3 Plan and states she is following her eating plan approximately 50-60 % of the time. She states she is exercising 0 minutes 0 days per week.   Interval History:  Since last office visit she has gained 2 pounds.  She is trying to eat a protein and vegetable with each meal.  She joined a gym but hasn't started going yet.  She is drinking water, protein shake and coffee daily.    Pharmacotherapy for weight loss: She is not currently taking medications  for medical weight loss.   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 [name] Campanile.  Her highest weight prior to surgery was 340 lbs and her nadir weight after surgery was 264 lbs. She is taking a MV (bariatric vitamin) with iron once daily, Vit D with calcium 2,000 IU BID and Vit B12 once daily. Since surgery she has noted some bloating and pain if she eats bread/pasta.    Prediabetes Has a history of DM that resolved after having bariatric surgery. Last A1c was 5.8.   Medication(s): I started her on Metformin 500mg  daily after her last visit.  Denies side effects.  Polyphagia:Yes and cravings. Has helped some but is still struggling with both.   Lab Results  Component Value Date   HGBA1C 5.8 (H) 08/22/2022   HGBA1C 6.3 (H) 06/30/2020   Lab Results  Component Value Date   INSULIN 12.6 08/22/2022    High serum Vit B12 Patient is taking a bariatric MV and Vit B12 2,000 daily.    PHYSICAL EXAM:  Blood pressure 119/75, pulse 84, temperature 97.9 F (36.6 C), height 5\' 2"  (1.575 m), weight 282 lb (127.9 kg), last menstrual period 09/29/2022, SpO2 100 %. Body mass index is 51.58 kg/m.  General: She is overweight, cooperative, alert, well developed, and in no acute distress. PSYCH: Has normal mood, affect and thought process.   Extremities: No edema.  Neurologic: No gross sensory or motor deficits. No tremors or fasciculations noted.    DIAGNOSTIC DATA REVIEWED:  BMET    Component Value Date/Time   NA 136 08/22/2022 0957   K 4.6 08/22/2022 0957   CL 99 08/22/2022 0957   CO2 23 08/22/2022 0957   GLUCOSE 91 08/22/2022 0957   GLUCOSE 120 (H) 09/11/2021 0113   BUN 11 08/22/2022 0957   CREATININE 0.83 08/22/2022 0957   CALCIUM 9.1 08/22/2022 0957   GFRNONAA >60 09/11/2021 0113   GFRAA >60 09/08/2014 2006   Lab Results  Component Value Date   HGBA1C 5.8 (H) 08/22/2022   HGBA1C 6.3 (H) 06/30/2020   Lab Results  Component Value Date   INSULIN 12.6 08/22/2022   Lab Results  Component Value Date   TSH 1.700 08/22/2022   CBC    Component Value Date/Time   WBC 9.4 09/11/2021 0113   RBC 4.16 09/11/2021 0113   HGB 12.7 09/11/2021 0113   HCT 39.5 09/11/2021 0113   PLT 243 09/11/2021 0113   MCV 95.0 09/11/2021 0113   MCH 30.5 09/11/2021 0113   MCHC 32.2 09/11/2021 0113   RDW 14.2 09/11/2021 0113   Iron Studies No results found for: "IRON", "TIBC", "FERRITIN", "IRONPCTSAT" Lipid Panel     Component Value Date/Time   CHOL 149  08/22/2022 0957   TRIG 78 08/22/2022 0957   HDL 69 08/22/2022 0957   LDLCALC 65 08/22/2022 0957   Hepatic Function Panel     Component Value Date/Time   PROT 7.1 08/22/2022 0957   ALBUMIN 4.2 08/22/2022 0957   AST 19 08/22/2022 0957   ALT 15 08/22/2022 0957   ALKPHOS 88 08/22/2022 0957   BILITOT 0.7 08/22/2022 0957   BILIDIR 0.5 (H) 08/17/2020 0826   IBILI 1.0 (H) 08/17/2020 0826      Component Value Date/Time   TSH 1.700 08/22/2022 0957   Nutritional Lab Results  Component Value Date   VD25OH 37.6 08/22/2022     ASSESSMENT AND PLAN  TREATMENT PLAN FOR OBESITY:  Recommended Dietary Goals  Gloria Kim is currently in the action stage of change. As such, her goal is to continue weight management plan. She has agreed to the Category 3 Plan. Would like to try the bariatric pouch reset diet until next visit.    Behavioral Intervention  We discussed the following Behavioral Modification Strategies today: increasing lean protein intake, decreasing simple carbohydrates , increasing vegetables, increasing lower glycemic fruits, increasing water intake, continue to practice mindfulness when eating, and planning for success.  Additional resources provided today: NA  Recommended Physical Activity Goals  Gloria Kim has been advised to work up to 150 minutes of moderate intensity aerobic activity a week and strengthening exercises 2-3 times per week for cardiovascular health, weight loss maintenance and preservation of muscle mass.   She has agreed to Think about ways to increase daily physical activity and overcoming barriers to exercise and Increase physical activity in their day and reduce sedentary time (increase NEAT).   Pharmacotherapy We discussed various medication options to help Gloria Kim with her weight loss efforts and we both agreed to consider her options.  Information given on Qsymia and Contrave.    ASSOCIATED CONDITIONS ADDRESSED TODAY  Action/Plan  High serum vitamin  B12 Stop second Vit B12 dose. Will recheck labs in 2-3 months.    Pre-diabetes Continue Metformin 500mg  daily.  RF Metformin 1 po daily  # 30.  Side effects discussed.    Consider BID at next visit.  Morbid obesity (HCC)  BMI 50.0-59.9, adult (HCC)     Needs to make appt to see bariatric surgery back for follow up.   Options discussed today: Increase Metformin to BID Liver shrinking diet Bariatric pouch reset diet   Return in about 3 weeks (around 10/26/2022).Marland Kitchen She was informed of the importance of frequent follow up visits to maximize her success with intensive lifestyle modifications for her multiple health conditions.   ATTESTASTION STATEMENTS:  Reviewed by clinician on day of visit: allergies, medications, problem list, medical history, surgical history, family history, social history,  and previous encounter notes.   Time spent on visit including pre-visit chart review and post-visit care and charting was 30 minutes.    Theodis Sato. Everley Evora FNP-C

## 2022-10-27 ENCOUNTER — Ambulatory Visit (INDEPENDENT_AMBULATORY_CARE_PROVIDER_SITE_OTHER): Payer: BC Managed Care – PPO | Admitting: Nurse Practitioner

## 2022-10-27 ENCOUNTER — Encounter: Payer: Self-pay | Admitting: Nurse Practitioner

## 2022-10-27 VITALS — BP 118/83 | HR 70 | Temp 98.2°F | Ht 62.0 in | Wt 277.0 lb

## 2022-10-27 DIAGNOSIS — R7303 Prediabetes: Secondary | ICD-10-CM

## 2022-10-27 DIAGNOSIS — Z6841 Body Mass Index (BMI) 40.0 and over, adult: Secondary | ICD-10-CM

## 2022-10-27 MED ORDER — METFORMIN HCL 500 MG PO TABS: 500.0000 mg | ORAL_TABLET | Freq: Two times a day (BID) | ORAL | 0 refills | Status: DC

## 2022-10-27 NOTE — Progress Notes (Signed)
Office: [phone]  /  Fax: [phone]  WEIGHT SUMMARY AND BIOMETRICS  Weight Lost Since Last Visit: 5lb  Weight Gained Since Last Visit: 0lb   Vitals Temp: 98.2 F (36.8 C) BP: 118/83 Pulse Rate: 70 SpO2: 95 %   Anthropometric Measurements Height: 5\' 2"  (1.575 m) Weight: 277 lb (125.6 kg) BMI (Calculated): 50.65 Weight at Last Visit: 282lb Weight Lost Since Last Visit: 5lb Weight Gained Since Last Visit: 0lb Starting Weight: 283lb Total Weight Loss (lbs): 6 lb (2.722 kg)   Body Composition  Body Fat %: 53.4 % Fat Mass (lbs): 148 lbs Muscle Mass (lbs): 122.4 lbs Total Body Water (lbs): 98.2 lbs Visceral Fat Rating : 19   Other Clinical Data Fasting: No Labs: No Today's Visit #: 4 Starting Date: 08/22/22     HPI  Chief Complaint: OBESITY  Gloria Kim is here to discuss her progress with her obesity treatment plan. She is on the the Category 3 Plan and states she is following her eating plan approximately 75 % of the time. She states she is exercising 30 minutes 2 days per week.   Interval History:  Since last office visit she has lost 5 pounds. She has been doing better with following the meal plan but is not sure she is eating enough due to her restriction.  She is making healthier choices and is watching her portion sizes.    BF:  coffee, 2 eggs alone or in a 1/2 wrap Snack:  sometimes fruit, vegetables with peanut butter Lunch:  pizza with wrap or pin wheels and salad Snack: popcorn Dinner:  protein and vegetable and salad Drinks:  water and G2   Pharmacotherapy for weight loss: She is not currently taking medications  for medical weight loss.    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 [name] Campanile.  Her highest weight prior to surgery was 340 lbs and her nadir weight after surgery was 264 lbs. She is  taking a MV (bariatric vitamin) with iron once daily, Vit D with calcium 2,000 IU BID and Vit B12 once daily. Since surgery she has noted some bloating and pain especiallyt if she eats bread/pasta. (Plans to make appt with Dr. [name] Campanile)    Prediabetes Last A1c was 5.8  Medication(s): Metformin 500mg  once daily. Denies side effects.   Polyphagia:Yes Lab Results  Component Value Date   HGBA1C 5.8 (H) 08/22/2022   HGBA1C 6.3 (H) 06/30/2020   Lab Results  Component Value Date   INSULIN 12.6 08/22/2022     PHYSICAL EXAM:  Blood pressure 118/83, pulse 70, temperature 98.2 F (36.8 C), height 5\' 2"  (1.575 m), weight 277 lb (125.6 kg), last menstrual period 09/29/2022, SpO2 95%. Body mass index is 50.66 kg/m.  General: She is overweight, cooperative, alert, well developed, and in no acute distress. PSYCH: Has normal mood, affect and thought process.   Extremities: No edema.  Neurologic: No gross sensory or motor deficits. No tremors or fasciculations noted.    DIAGNOSTIC DATA REVIEWED:  BMET    Component Value Date/Time   NA 136 08/22/2022 0957   K 4.6 08/22/2022 0957   CL 99 08/22/2022 0957   CO2 23 08/22/2022 0957   GLUCOSE 91 08/22/2022 0957   GLUCOSE 120 (H) 09/11/2021 0113   BUN 11 08/22/2022 0957   CREATININE 0.83 08/22/2022 0957   CALCIUM 9.1 08/22/2022 0957  GFRNONAA >60 09/11/2021 0113   GFRAA >60 09/08/2014 2006   Lab Results  Component Value Date   HGBA1C 5.8 (H) 08/22/2022   HGBA1C 6.3 (H) 06/30/2020   Lab Results  Component Value Date   INSULIN 12.6 08/22/2022   Lab Results  Component Value Date   TSH 1.700 08/22/2022   CBC    Component Value Date/Time   WBC 9.4 09/11/2021 0113   RBC 4.16 09/11/2021 0113   HGB 12.7 09/11/2021 0113   HCT 39.5 09/11/2021 0113   PLT 243 09/11/2021 0113   MCV 95.0 09/11/2021 0113   MCH 30.5 09/11/2021 0113   MCHC 32.2 09/11/2021 0113   RDW 14.2 09/11/2021 0113   Iron Studies No results found for: "IRON",  "TIBC", "FERRITIN", "IRONPCTSAT" Lipid Panel     Component Value Date/Time   CHOL 149 08/22/2022 0957   TRIG 78 08/22/2022 0957   HDL 69 08/22/2022 0957   LDLCALC 65 08/22/2022 0957   Hepatic Function Panel     Component Value Date/Time   PROT 7.1 08/22/2022 0957   ALBUMIN 4.2 08/22/2022 0957   AST 19 08/22/2022 0957   ALT 15 08/22/2022 0957   ALKPHOS 88 08/22/2022 0957   BILITOT 0.7 08/22/2022 0957   BILIDIR 0.5 (H) 08/17/2020 0826   IBILI 1.0 (H) 08/17/2020 0826      Component Value Date/Time   TSH 1.700 08/22/2022 0957   Nutritional Lab Results  Component Value Date   VD25OH 37.6 08/22/2022     ASSESSMENT AND PLAN  TREATMENT PLAN FOR OBESITY:  Recommended Dietary Goals  Gloria Kim is currently in the action stage of change. As such, her goal is to continue weight management plan. She has agreed to the Category 3 Plan.  Add one protein shake daily  Behavioral Intervention  We discussed the following Behavioral Modification Strategies today: increasing lean protein intake, decreasing simple carbohydrates , increasing vegetables, increasing lower glycemic fruits, increasing fiber rich foods, avoiding skipping meals, increasing water intake, continue to practice mindfulness when eating, and planning for success.  Additional resources provided today: NA  Recommended Physical Activity Goals  Gloria Kim has been advised to work up to 150 minutes of moderate intensity aerobic activity a week and strengthening exercises 2-3 times per week for cardiovascular health, weight loss maintenance and preservation of muscle mass.   She has agreed to Continue current level of physical activity , Think about ways to increase daily physical activity and overcoming barriers to exercise, and Increase physical activity in their day and reduce sedentary time (increase NEAT).    ASSOCIATED CONDITIONS ADDRESSED TODAY  Action/Plan  Pre-diabetes -    Increase metFORMIN HCl; Take 1 tablet  (500 mg total) by mouth 2 (two) times daily with a meal.  Dispense: 60 tablet; Refill: 0  Gloria Kim will continue to work on weight loss, exercise, and decreasing simple carbohydrates to help decrease the risk of diabetes.    Notes some polyphagia and cravings.   Morbid obesity (HCC)  BMI 50.0-59.9, adult [facility])       Plans to contact Dr. Tawana Scale office to schedule a follow up appointment.   To discuss Qsymia vs Contrave at next visit   Return in about 4 weeks (around 11/24/2022).Marland Kitchen She was informed of the importance of frequent follow up visits to maximize her success with intensive lifestyle modifications for her multiple health conditions.   ATTESTASTION STATEMENTS:  Reviewed by clinician on day of visit: allergies, medications, problem list, medical history, surgical history, family history, social  history, and previous encounter notes.     Theodis Sato. Vernette Moise FNP-C

## 2022-11-14 ENCOUNTER — Other Ambulatory Visit: Payer: Self-pay | Admitting: Student

## 2022-11-14 DIAGNOSIS — R1310 Dysphagia, unspecified: Secondary | ICD-10-CM

## 2022-11-30 ENCOUNTER — Ambulatory Visit: Payer: BC Managed Care – PPO | Admitting: Nurse Practitioner

## 2022-12-01 ENCOUNTER — Other Ambulatory Visit: Payer: BC Managed Care – PPO

## 2022-12-31 ENCOUNTER — Other Ambulatory Visit: Payer: Self-pay | Admitting: Nurse Practitioner

## 2022-12-31 DIAGNOSIS — R7303 Prediabetes: Secondary | ICD-10-CM

## 2023-01-23 ENCOUNTER — Ambulatory Visit
Admission: RE | Admit: 2023-01-23 | Discharge: 2023-01-23 | Disposition: A | Discharge status: Home / Self Care | Payer: BC Managed Care – PPO | Source: Ambulatory Visit | Attending: Student | Admitting: Student

## 2023-01-23 DIAGNOSIS — R1310 Dysphagia, unspecified: Secondary | ICD-10-CM

## 2023-02-21 ENCOUNTER — Encounter: Payer: Self-pay | Admitting: Nurse Practitioner

## 2023-02-21 ENCOUNTER — Ambulatory Visit (INDEPENDENT_AMBULATORY_CARE_PROVIDER_SITE_OTHER): Payer: BC Managed Care – PPO | Admitting: Nurse Practitioner

## 2023-02-21 VITALS — BP 124/83 | HR 83 | Temp 98.2°F | Ht 62.0 in | Wt 278.0 lb

## 2023-02-21 DIAGNOSIS — Z6841 Body Mass Index (BMI) 40.0 and over, adult: Secondary | ICD-10-CM

## 2023-02-21 DIAGNOSIS — E65 Localized adiposity: Secondary | ICD-10-CM

## 2023-02-21 NOTE — Progress Notes (Signed)
Office: [phone]  /  Fax: [phone]  WEIGHT SUMMARY AND BIOMETRICS  Weight Lost Since Last Visit: 0lb  Weight Gained Since Last Visit: 1lb   Vitals Temp: 98.2 F (36.8 C) BP: 124/83 Pulse Rate: 83 SpO2: 96 %   Anthropometric Measurements Height: 5\' 2"  (1.575 m) Weight: 278 lb (126.1 kg) BMI (Calculated): 50.83 Weight at Last Visit: 277lb Weight Lost Since Last Visit: 0lb Weight Gained Since Last Visit: 1lb Starting Weight: 283lb Total Weight Loss (lbs): 5 lb (2.268 kg)   Body Composition  Body Fat %: 55.3 % Fat Mass (lbs): 153.8 lbs Muscle Mass (lbs): 118 lbs Visceral Fat Rating : 20   Other Clinical Data Fasting: No Labs: No Today's Visit #: 5 Starting Date: 08/22/22     HPI  Chief Complaint: OBESITY  Gloria Kim is here to discuss her progress with her obesity treatment plan. She is on the the Category 3 Plan and states she is following her eating plan approximately 50 % of the time. She states she is exercising 0 minutes 0 days per week.   Interval History:  Since last office visit she has gained 1 pound.  She started a new job and had to cancel appointments until she got out of orientation.  She is not tracking her calories/macros.  She is watching what she eats.  Still struggles with "eating a lot of food".  She is eating 2-3 meals and 3 snacks per day.  She is snaking on popcorn, grapes, bananas, peanuts, trail mix.  She is drinking water daily.  Denies sugary drinks   She is more active-walking more.     Pharmacotherapy for weight loss: She is not currently taking medications  for medical weight loss.    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 [name] Campanile.  Her highest weight prior to surgery was 340 lbs and her nadir weight after surgery was 264 lbs. She is taking a MV (bariatric vitamin) with iron  once daily, Vit D with calcium TID and Vit B12 once daily. (To bring in next visit to review)   She saw [facility] last on 11/08/22.   Had UGI on 01/23/23: "IMPRESSION: 1. Mild esophageal dysmotility, probably due to chronic reflux related dysmotility. 2. Expected postsurgical changes from gastric bypass surgery with no evidence of anastomotic or gastric pouch complication. No hiatal hernia or gastroesophageal reflux." Note copied from UGI note  Visceral obesity Worsening-today was 20  PHYSICAL EXAM:  Blood pressure 124/83, pulse 83, temperature 98.2 F (36.8 C), height 5\' 2"  (1.575 m), weight 278 lb (126.1 kg), last menstrual period 02/07/2023, SpO2 96%. Body mass index is 50.85 kg/m.  General: She is overweight, cooperative, alert, well developed, and in no acute distress. PSYCH: Has normal mood, affect and thought process.   Extremities: No edema.  Neurologic: No gross sensory or motor deficits. No tremors or fasciculations noted.    DIAGNOSTIC DATA REVIEWED:  BMET    Component Value Date/Time   NA 136 08/22/2022 0957   K 4.6 08/22/2022 0957   CL 99 08/22/2022 0957   CO2 23 08/22/2022 0957   GLUCOSE 91 08/22/2022 0957   GLUCOSE 120 (H) 09/11/2021 0113   BUN 11 08/22/2022 0957   CREATININE 0.83 08/22/2022 0957   CALCIUM 9.1 08/22/2022 0957   GFRNONAA >60 09/11/2021 0113   GFRAA >60 09/08/2014 2006   Lab Results  Component Value Date   HGBA1C 5.8 (H) 08/22/2022   HGBA1C 6.3 (H) 06/30/2020   Lab Results  Component Value Date   INSULIN 12.6 08/22/2022   Lab Results  Component Value Date   TSH 1.700 08/22/2022   CBC    Component Value Date/Time   WBC 9.4 09/11/2021 0113   RBC 4.16 09/11/2021 0113   HGB 12.7 09/11/2021 0113   HCT 39.5 09/11/2021 0113   PLT 243 09/11/2021 0113   MCV 95.0 09/11/2021 0113   MCH 30.5 09/11/2021 0113   MCHC 32.2 09/11/2021 0113   RDW 14.2 09/11/2021 0113   Iron Studies No results found for: "IRON", "TIBC", "FERRITIN",  "IRONPCTSAT" Lipid Panel     Component Value Date/Time   CHOL 149 08/22/2022 0957   TRIG 78 08/22/2022 0957   HDL 69 08/22/2022 0957   LDLCALC 65 08/22/2022 0957   Hepatic Function Panel     Component Value Date/Time   PROT 7.1 08/22/2022 0957   ALBUMIN 4.2 08/22/2022 0957   AST 19 08/22/2022 0957   ALT 15 08/22/2022 0957   ALKPHOS 88 08/22/2022 0957   BILITOT 0.7 08/22/2022 0957   BILIDIR 0.5 (H) 08/17/2020 0826   IBILI 1.0 (H) 08/17/2020 0826      Component Value Date/Time   TSH 1.700 08/22/2022 0957   Nutritional Lab Results  Component Value Date   VD25OH 37.6 08/22/2022     ASSESSMENT AND PLAN  TREATMENT PLAN FOR OBESITY:  Recommended Dietary Goals  Gloria Kim is currently in the action stage of change. As such, her goal is to continue weight management plan. She has agreed to the Category 3 Plan-1500-1600 calories and 90+ grams of protein.  Will review at next visit.     Behavioral Intervention  We discussed the following Behavioral Modification Strategies today: increasing lean protein intake to established goals, increasing water intake , work on meal planning and preparation, work on tracking and journaling calories using tracking application, and continue to work on maintaining a reduced calorie state, getting the recommended amount of protein, incorporating whole foods, making healthy choices, staying well hydrated and practicing mindfulness when eating..  Additional resources provided today: NA  Recommended Physical Activity Goals  Gloria Kim has been advised to work up to 150 minutes of moderate intensity aerobic activity a week and strengthening exercises 2-3 times per week for cardiovascular health, weight loss maintenance and preservation of muscle mass.   She has agreed to Think about enjoyable ways to increase daily physical activity and overcoming barriers to exercise, Increase physical activity in their day and reduce sedentary time (increase NEAT)., and  Work on scheduling and tracking physical activity.    Pharmacotherapy We discussed various medication options to help Gloria Kim with her weight loss efforts and we both agreed to consider options after seeing Dr. Tawana Scale office.  According to notes, needs upper EGD.  Patient to call and discuss with [facility].    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    Gloria Kim will continue to work on weight loss, exercise, and decreasing simple carbohydrates.    Morbid obesity (HCC)  BMI 50.0-59.9, adult [facility])      To follow up with Dr. Tawana Scale office   Return in about 3 weeks (around 03/14/2023).Marland Kitchen She was informed of the importance of frequent follow up visits to maximize her success with intensive lifestyle modifications for her multiple health conditions.   ATTESTASTION STATEMENTS:  Reviewed by clinician on day of visit: allergies,  medications, problem list, medical history, surgical history, family history, social history, and previous encounter notes.   Time spent on visit including pre-visit chart review and post-visit care and charting was 30 minutes.    Theodis Sato. Leslye Puccini FNP-C

## 2023-02-26 ENCOUNTER — Encounter: Payer: Self-pay | Admitting: Nurse Practitioner

## 2023-03-13 ENCOUNTER — Encounter: Payer: Self-pay | Admitting: Nurse Practitioner

## 2023-03-13 ENCOUNTER — Ambulatory Visit: Payer: BC Managed Care – PPO | Admitting: Nurse Practitioner

## 2023-04-18 ENCOUNTER — Ambulatory Visit (INDEPENDENT_AMBULATORY_CARE_PROVIDER_SITE_OTHER): Payer: BC Managed Care – PPO | Admitting: Nurse Practitioner

## 2023-04-18 ENCOUNTER — Encounter: Payer: Self-pay | Admitting: Nurse Practitioner

## 2023-04-18 VITALS — BP 122/87 | HR 85 | Temp 98.4°F | Ht 62.0 in | Wt 276.0 lb

## 2023-04-18 DIAGNOSIS — Z6841 Body Mass Index (BMI) 40.0 and over, adult: Secondary | ICD-10-CM | POA: Diagnosis not present

## 2023-04-18 DIAGNOSIS — R7303 Prediabetes: Secondary | ICD-10-CM

## 2023-04-18 MED ORDER — METFORMIN HCL 500 MG PO TABS
500.0000 mg | ORAL_TABLET | Freq: Two times a day (BID) | ORAL | 0 refills | Status: DC
Start: 2023-04-18 — End: 2023-06-06

## 2023-04-18 MED ORDER — WEGOVY 0.25 MG/0.5ML ~~LOC~~ SOAJ
0.2500 mg | SUBCUTANEOUS | 0 refills | Status: DC
Start: 2023-04-18 — End: 2023-06-06

## 2023-04-18 NOTE — Progress Notes (Signed)
 Office: [phone]  /  Fax: [phone]  WEIGHT SUMMARY AND BIOMETRICS  Weight Lost Since Last Visit: 2lb  Weight Gained Since Last Visit: 0lb   Vitals Temp: 98.4 F (36.9 C) BP: 122/87 Pulse Rate: 85 SpO2: 95 %   Anthropometric Measurements Height: 5' 2 (1.575 m) Weight: 276 lb (125.2 kg) BMI (Calculated): 50.47 Weight at Last Visit: 278lb Weight Lost Since Last Visit: 2lb Weight Gained Since Last Visit: 0lb Starting Weight: 283lb Total Weight Loss (lbs): 7 lb (3.175 kg)   Body Composition  Body Fat %: 55.4 % Fat Mass (lbs): 153.2 lbs Muscle Mass (lbs): 117.2 lbs Visceral Fat Rating : 20   Other Clinical Data Fasting: No Labs: No Today's Visit #: 6 Starting Date: 08/22/22     HPI  Chief Complaint: OBESITY  Gloria Kim is here to discuss her progress with her obesity treatment plan. She is on the the Category 3 Plan and states she is following her eating plan approximately 75 % of the time. She states she is exercising 0 minutes 0 days per week.   Interval History:  Since last office visit she has lost 2 pounds.  She has not been logging/tracking her food due to time.  She is eating 2-3 meals and 1-2 snack per day.  She feels that she did well over Christmas.  She is not exercising due to knee pain.  She is averaging around 3 miles at work.    Pharmacotherapy for weight loss: She is not currently taking medications  for medical weight loss.     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 Tanda.  Her highest weight prior to surgery was 340 lbs and her nadir weight after surgery was 264 lbs. She is taking a MV (bariatric vitamin) with iron once daily and Vit D with calcium TID.  Has appt with Dr. Tanda on 04/27/23-UGI was 01/23/23  Prediabetes Last A1c was 5.8  Medication(s): Metformin  500mg  BID. Denies  side effects.   Polyphagia: has helped with polyphagia and cravings.   Lab Results  Component Value Date   HGBA1C 5.8 (H) 08/22/2022   HGBA1C 6.3 (H) 06/30/2020   Lab Results  Component Value Date   INSULIN  12.6 08/22/2022       PHYSICAL EXAM:  Blood pressure 122/87, pulse 85, temperature 98.4 F (36.9 C), height 5' 2 (1.575 m), weight 276 lb (125.2 kg), last menstrual period 04/04/2023, SpO2 95%. Body mass index is 50.48 kg/m.  General: She is overweight, cooperative, alert, well developed, and in no acute distress. PSYCH: Has normal mood, affect and thought process.   Extremities: No edema.  Neurologic: No gross sensory or motor deficits. No tremors or fasciculations noted.    DIAGNOSTIC DATA REVIEWED:  BMET    Component Value Date/Time   NA 136 08/22/2022 0957   K 4.6 08/22/2022 0957   CL 99 08/22/2022 0957   CO2 23 08/22/2022 0957   GLUCOSE 91 08/22/2022 0957   GLUCOSE 120 (H) 09/11/2021 0113   BUN 11 08/22/2022 0957   CREATININE 0.83 08/22/2022 0957   CALCIUM 9.1 08/22/2022 0957   GFRNONAA >60 09/11/2021 0113   GFRAA >60 09/08/2014 2006   Lab Results  Component Value Date   HGBA1C 5.8 (H) 08/22/2022   HGBA1C 6.3 (H) 06/30/2020   Lab Results  Component Value Date   INSULIN  12.6 08/22/2022  Lab Results  Component Value Date   TSH 1.700 08/22/2022   CBC    Component Value Date/Time   WBC 9.4 09/11/2021 0113   RBC 4.16 09/11/2021 0113   HGB 12.7 09/11/2021 0113   HCT 39.5 09/11/2021 0113   PLT 243 09/11/2021 0113   MCV 95.0 09/11/2021 0113   MCH 30.5 09/11/2021 0113   MCHC 32.2 09/11/2021 0113   RDW 14.2 09/11/2021 0113   Iron Studies No results found for: IRON, TIBC, FERRITIN, IRONPCTSAT Lipid Panel     Component Value Date/Time   CHOL 149 08/22/2022 0957   TRIG 78 08/22/2022 0957   HDL 69 08/22/2022 0957   LDLCALC 65 08/22/2022 0957   Hepatic Function Panel     Component Value Date/Time   PROT 7.1 08/22/2022 0957    ALBUMIN 4.2 08/22/2022 0957   AST 19 08/22/2022 0957   ALT 15 08/22/2022 0957   ALKPHOS 88 08/22/2022 0957   BILITOT 0.7 08/22/2022 0957   BILIDIR 0.5 (H) 08/17/2020 0826   IBILI 1.0 (H) 08/17/2020 0826      Component Value Date/Time   TSH 1.700 08/22/2022 0957   Nutritional Lab Results  Component Value Date   VD25OH 37.6 08/22/2022     ASSESSMENT AND PLAN  TREATMENT PLAN FOR OBESITY:  Recommended Dietary Goals  Gloria Kim is currently in the action stage of change. As such, her goal is to continue weight management plan. She has agreed to the Category 3 Plan-aim for 1500 calories and 100 grams of protein.   Behavioral Intervention  We discussed the following Behavioral Modification Strategies today: increasing lean protein intake to established goals, decreasing simple carbohydrates , increasing vegetables, increasing fiber rich foods, increasing water  intake , work on meal planning and preparation, reading food labels , keeping healthy foods at home, and continue to work on maintaining a reduced calorie state, getting the recommended amount of protein, incorporating whole foods, making healthy choices, staying well hydrated and practicing mindfulness when eating..  Additional resources provided today: NA  Recommended Physical Activity Goals  Gloria Kim has been advised to work up to 150 minutes of moderate intensity aerobic activity a week and strengthening exercises 2-3 times per week for cardiovascular health, weight loss maintenance and preservation of muscle mass.   She has agreed to Think about enjoyable ways to increase daily physical activity and overcoming barriers to exercise, Increase physical activity in their day and reduce sedentary time (increase NEAT)., and Work on scheduling and tracking physical activity.    Pharmacotherapy We discussed various medication options to help Gloria Kim with her weight loss efforts and we both agreed to start Wegovy  0.25mg .  Patient is in  the same sex relationship.    Contraindications:  Pancreatitis (active gallstones) Medullary thyroid cancer High triglycerides (>500)-will need labs prior to starting Multiple Endocrine Neoplasia syndrome type 2 (MEN 2) Trying to get pregnant Breastfeeding Use with caution with taking insulin  or sulfonylureas (will need to monitor blood sugars for hypoglycemia)  ASSOCIATED CONDITIONS ADDRESSED TODAY  Action/Plan  Pre-diabetes -     metFORMIN  HCl; Take 1 tablet (500 mg total) by mouth 2 (two) times daily with a meal.  Dispense: 60 tablet; Refill: 0. Side effects discussed.  Will decrease to once a day when starting Wegovy .    Morbid obesity (HCC) -     Wegovy ; Inject 0.25 mg into the skin once a week.  Dispense: 2 mL; Refill: 0  BMI 50.0-59.9, adult (HCC) -     Wegovy ; Inject 0.25  mg into the skin once a week.  Dispense: 2 mL; Refill: 0      Will order labs at next visit.  Patient will have done at [facility]   Return in about 4 weeks (around 05/16/2023).SABRA She was informed of the importance of frequent follow up visits to maximize her success with intensive lifestyle modifications for her multiple health conditions.   ATTESTASTION STATEMENTS:  Reviewed by clinician on day of visit: allergies, medications, problem list, medical history, surgical history, family history, social history, and previous encounter notes.     Gloria Kim SAUNDERS. Nahla Lukin FNP-C

## 2023-04-18 NOTE — Patient Instructions (Addendum)
 What is a Steps to starting your WegovyT  The office staff will send a prior authorization request to your insurance company for approval. We will send you a mychart message once we hear back from your insurance with a decision.  This can take up to 7-10 business days.   Once your WegovyTis approved, you may then pick up Wegovy  pen from your pharmacy.    Learn how to do Wegovy  injections on the Wegovy .com website. There is a training video that will walk you through how to safely perform the injection. If you have questions for our clinical staff, please contact our  clinical staff. If you have any symptoms of allergic reaction to [facility] discontinue immediately and call 911.  1. What should I tell my provider before using WegovyT ? have or have had problems with your pancreas or kidneys. have type 2 diabetes and a history of diabetic retinopathy. have or have had depression, suicidal thoughts, or mental health issues. are pregnant or plan to become pregnant. Bosie may harm your unborn baby. You should stop using WegovyT 3 months before you plan to become pregnant or if you are breastfeeding or plan to breastfeed. It is not known if WegovyT passes into your breast milk.  2. What is Bosie and how does it work?  Bosie is an injectable prescription medication prescribed by your provider to help with your weight loss.  This medicine will be most effective when combined with a reduced calorie diet and physical activity.  Bosie is not for the treatment of type 2 diabetes mellitus. Bosie should not be used with other GLP-1 receptor agonist medicines. The addition of WegovyT in  patients treated with insulin  has not been evaluated. When initiating WegovyT, consider reducing the dose of concomitantly administered insulin  secretagogues (such as sulfonylureas) or insulin  to reduce the risk of  hypoglycemia.  One role of GLP-1 is to send a signal to your brain to tell it you are full. It also  slows down stomach emptying which will make you feel full longer and may help with reducing cravings.   3.  How should I take WegovyT?  Administer WegovyT once weekly, on the same day each week, at any time of day, with or without meals Inject subcutaneously in the abdomen, thigh or upper arm Initiate at 0.25 mg once weekly for 4 weeks. In 4 week intervals, increase the dose until a dose of 2.4 mg is reached (we will discuss with you the dosage at each visit). The maintenance dose of WegovyT is 2.4 mg once weekly.  The dosing schedule of Wegovy  is:  0.25 mg per week X 4 weeks 0.5 mg per week X 4 weeks 1.0 mg per week X 4 weeks 1.7 mg per week X 4 weeks 2.4 mg per week   Missed dose   If you miss your injection day, go ahead inject your current dose. You can go >7 days, but not <7 days between injections. You may change your injection day (It must be >7 days). If you miss >2 doses, you can still keep next injection dose the same or follow de-escalation schedule which may minimize GI symptoms.   In patients with type 2 diabetes, monitor blood glucose prior to starting and during WEGOVYT treatment.   Inject your dose of Wegovy  under the skin (subcutaneous injection) in your stomach area (abdomen), upper leg (thigh) or upper arm. Do not inject into a vein or a muscle. The injection site should be rotated and not  given in the same spot each day. Hold the needle under the skin and count to "10". This will allow all of the medicine to be dispensed under the skin. Always wipe your skin with an alcohol  prep pad before injection  Dispose of used pen in an approved sharps container. More practical options that can be put in the trash  to go to the landfill are milk jugs or plastic laundry detergent containers with a screw on lid.  What side effects may I notice from taking WegovyT?  Side effects that usually do not require medical attention (report to our office if they continue or are  bothersome): Nausea (most common but decreases over time in most people as their body gets used to the medicine) Diarrhea Constipation (you may take an over the counter laxative if needed) Headache Decreased appetite Upset stomach Tiredness Dizziness Feeling bloated Hair loss Belching Gas Heartburn  Side effects that you should call 911 as soon as possible Vomiting Stomach pain Fever Yellowing of your skin or eyes  Clay-colored stools Increased heart rate while at rest Low blood sugar  Sudden changes in mood, behaviors, thoughts, feelings, or thoughts of suicide If you get a lump or swelling in your neck, hoarseness, trouble swallowing, or shortness of breath. Allergic reaction such as skin rash, itching, hives, swelling of the face, tongue, or lips  Helpful tips for managing nausea Nausea is a common side effect when first starting WegovyT. If you experience nausea, be sure to connect with your health care provider. He or she will offer guidance on ways to manage it, which may include: Eat bland, low-fat foods, like crackers, toast and rice  Eat foods that contain water , like soups and gelatin  Avoid lying down after you eat  Go outdoors for fresh air  Eat more slowly    Other important information Do not drop your pen or knock it against hard surfaces  Do not expose your pen to any liquids  If you think that your pen may be damaged, do not try to fix it. Use a new one Keep the pen cap on until you are ready to inject. Your pen will no longer be sterile if you store an unused pen without the cap, if you pull the pen cap off and put it on again, or if the pen cap is missing. This could lead to an infection  Store the [place] pen in the refrigerator from 76F to 33F (2C to 8C) If needed, before removing the pen cap, WegovyT can be stored from 8C to 30C (33F to 56F) in the original carton for up to 28 days.  Keep WegovyT in the original carton to protect it from light   Do not freeze  Throw away pen if WegovyT has been frozen, has been exposed to light or temperatures above 56F (30C), or has been out of the refrigerator for 28 days or longer It's important to properly dispose of your used WegovyT pens. Do not throw the pen away in your household trash. Instead, use an FDA-cleared sharps disposable container or a sturdy household container with a tight-fitting lid, like a heavy duty plastic container.   Wegovy  pen training website: https://www.wegovy .com/about-wegovy /how-to-use-the-wegovy -pen.html  Wegovy  savings and support link: https://www.wegovy .com/saving-and-support/save-and-support.html   1 Glucagon like peptide-1 (GLP-1) agonists represent a class of medications used to treat type 2 diabetes mellitus and obesity.  GLP-1 medications mimic the action of a hormone called glucagon like peptide 1.  When blood sugar levels start to rise/increase  these drugs stimulate the body to produce more insulin .  When that happens, the extra insulin  helps to lower the blood sugar levels in the body.  This in returns helps with decreasing cravings.  These medications also slow the movement of food from the stomach into the small intestine.  This in return helps one to full faster and longer.   Diabetic medications: Approved for treatment of diabetes mellitus but does not have full approval for weight loss use Victoza (liraglutide) Ozempic (semaglutide ) Mounjaro  Trulicity Rybelsus  Weight loss medications: Approved for long-term weight loss use.        Saxenda (liraglutide) Wegovy  (semaglutide ) Zepbound  Contraindications:  Pancreatitis (active gallstones) Medullary thyroid cancer High triglycerides (>500)-will need labs prior to starting Multiple Endocrine Neoplasia syndrome type 2 (MEN 2) Trying to get pregnant Breastfeeding Use with caution with taking insulin  or sulfonylureas (will need to monitor blood sugars for hypoglycemia) Side effects (most  common): Most common side effects are nausea, gas, bloating and constipation.  Other possible side effects are headaches, belching, diarrhea, tiredness (fatigue), vomiting, upset stomach, dizziness, heartburn and stomach (abdominal pain).  If you think that you are becoming dehydrated, please inform our office or your primary family provider.  Stop immediately and go to ER if you have any symptoms of a serious allergic reaction including swelling of your face, lips, tongue or throat; problems breathing or swallowing; severe rash or itching; fainting or feeling dizzy; or very rapid heart rate.

## 2023-04-24 ENCOUNTER — Encounter: Payer: Self-pay | Admitting: Nurse Practitioner

## 2023-04-27 ENCOUNTER — Other Ambulatory Visit: Payer: Self-pay | Admitting: General Surgery

## 2023-04-27 DIAGNOSIS — Z9884 Bariatric surgery status: Secondary | ICD-10-CM

## 2023-04-27 DIAGNOSIS — R110 Nausea: Secondary | ICD-10-CM

## 2023-04-27 DIAGNOSIS — R109 Unspecified abdominal pain: Secondary | ICD-10-CM

## 2023-05-03 ENCOUNTER — Telehealth: Payer: Self-pay

## 2023-05-03 NOTE — Telephone Encounter (Signed)
 PA submitted through Cover My Meds for Wegovy . Awaiting insurance determination. Key: [id number]

## 2023-05-04 ENCOUNTER — Ambulatory Visit
Admission: RE | Admit: 2023-05-04 | Discharge: 2023-05-04 | Disposition: A | Discharge status: Home / Self Care | Payer: Medicaid Other | Source: Ambulatory Visit | Attending: General Surgery | Admitting: General Surgery

## 2023-05-04 DIAGNOSIS — Z9884 Bariatric surgery status: Secondary | ICD-10-CM

## 2023-05-04 DIAGNOSIS — R109 Unspecified abdominal pain: Secondary | ICD-10-CM

## 2023-05-04 DIAGNOSIS — R110 Nausea: Secondary | ICD-10-CM

## 2023-05-04 MED ORDER — IOPAMIDOL (ISOVUE-300) INJECTION 61%
100.0000 mL | Freq: Once | INTRAVENOUS | Status: AC | PRN
  Administered 2023-05-04: 100 mL via INTRAVENOUS

## 2023-05-04 NOTE — Telephone Encounter (Signed)
Message from plan: Request Reference Number: [id number]. WEGOVY INJ 0.25MG  is approved through 10/31/2023. For further questions, call [employer] at [phone].Marland Kitchen Authorization Expiration Date: October 31, 2023.

## 2023-05-16 ENCOUNTER — Other Ambulatory Visit: Payer: Self-pay | Admitting: Nurse Practitioner

## 2023-05-16 ENCOUNTER — Ambulatory Visit: Payer: Medicaid Other | Admitting: Nurse Practitioner

## 2023-05-16 ENCOUNTER — Encounter: Payer: Self-pay | Admitting: Nurse Practitioner

## 2023-05-16 VITALS — BP 132/77 | HR 80 | Temp 97.7°F | Ht 62.0 in | Wt 274.0 lb

## 2023-05-16 DIAGNOSIS — Z6841 Body Mass Index (BMI) 40.0 and over, adult: Secondary | ICD-10-CM

## 2023-05-16 DIAGNOSIS — E65 Localized adiposity: Secondary | ICD-10-CM

## 2023-05-16 DIAGNOSIS — R7303 Prediabetes: Secondary | ICD-10-CM

## 2023-05-16 NOTE — Progress Notes (Signed)
Office: [phone]  /  Fax: [phone]  WEIGHT SUMMARY AND BIOMETRICS  Weight Lost Since Last Visit: 2lb  Weight Gained Since Last Visit: 0lb   Vitals Temp: 97.7 F (36.5 C) BP: 132/77 Pulse Rate: 80 SpO2: 97 %   Anthropometric Measurements Height: 5\' 2"  (1.575 m) Weight: 274 lb (124.3 kg) BMI (Calculated): 50.1 Weight at Last Visit: 276lb Weight Lost Since Last Visit: 2lb Weight Gained Since Last Visit: 0lb Starting Weight: 283lb Total Weight Loss (lbs): 9 lb (4.082 kg)   Body Composition  Body Fat %: 54.9 % Fat Mass (lbs): 150.6 lbs Muscle Mass (lbs): 117.4 lbs Visceral Fat Rating : 20   Other Clinical Data Fasting: No Labs: No Today's Visit #: 7 Starting Date: 08/22/22     HPI  Chief Complaint: OBESITY  Lavilla is here to discuss her progress with her obesity treatment plan. She is on the the Category 3 Plan and states she is following her eating plan approximately 50 % of the time. She states she is exercising 0 minutes 0 days per week.   Interval History:  Since last office visit she has lost 2 pounds.  She is averaging around 1500 calories and 70+ grams of protein. Notes she gets full easy and struggles with eating a full meal.  She averages around 4,000-5,000 steps per day.    Pharmacotherapy for weight loss: She is currently taking Wegovy 0.25mg  for medical weight loss.  Reports some fatigue.    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 [name] Campanile.  Her highest weight prior to surgery was 340 lbs and her nadir weight after surgery was 264 lbs. She is taking a MV (bariatric vitamin) with iron once daily and Vit D with calcium TID.  She saw Dr. [name] Campanile last on 04/27/23  Had CT abd/pelvis on 05/04/23:  "IMPRESSION: 1. Prior gastric bypass surgery without  evidence of bowel obstruction. 2. Colonic stool burden suggestive of constipation. 3. Mild symmetric distal esophageal wall thickening, nonspecific but can be seen in the setting of esophagitis."  Note copied from CT abd/pelvis  Last colonoscopy was 10/10/2018  PHYSICAL EXAM:  Blood pressure 132/77, pulse 80, temperature 97.7 F (36.5 C), height 5\' 2"  (1.575 m), weight 274 lb (124.3 kg), last menstrual period 05/09/2023, SpO2 97%. Body mass index is 50.12 kg/m.  General: She is overweight, cooperative, alert, well developed, and in no acute distress. PSYCH: Has normal mood, affect and thought process.   Extremities: No edema.  Neurologic: No gross sensory or motor deficits. No tremors or fasciculations noted.    DIAGNOSTIC DATA REVIEWED:  BMET    Component Value Date/Time   NA 136 08/22/2022 0957   K 4.6 08/22/2022 0957   CL 99 08/22/2022 0957   CO2 23 08/22/2022 0957   GLUCOSE 91 08/22/2022 0957   GLUCOSE 120 (H) 09/11/2021 0113   BUN 11 08/22/2022 0957   CREATININE 0.83 08/22/2022 0957   CALCIUM 9.1 08/22/2022 0957   GFRNONAA >60 09/11/2021 0113   GFRAA >60 09/08/2014 2006   Lab Results  Component Value Date   HGBA1C 5.8 (H) 08/22/2022   HGBA1C 6.3 (H) 06/30/2020   Lab Results  Component Value Date   INSULIN 12.6 08/22/2022   Lab Results  Component Value Date   TSH 1.700 08/22/2022   CBC  Component Value Date/Time   WBC 9.4 09/11/2021 0113   RBC 4.16 09/11/2021 0113   HGB 12.7 09/11/2021 0113   HCT 39.5 09/11/2021 0113   PLT 243 09/11/2021 0113   MCV 95.0 09/11/2021 0113   MCH 30.5 09/11/2021 0113   MCHC 32.2 09/11/2021 0113   RDW 14.2 09/11/2021 0113   Iron Studies No results found for: "IRON", "TIBC", "FERRITIN", "IRONPCTSAT" Lipid Panel     Component Value Date/Time   CHOL 149 08/22/2022 0957   TRIG 78 08/22/2022 0957   HDL 69 08/22/2022 0957   LDLCALC 65 08/22/2022 0957   Hepatic Function Panel     Component Value Date/Time   PROT  7.1 08/22/2022 0957   ALBUMIN 4.2 08/22/2022 0957   AST 19 08/22/2022 0957   ALT 15 08/22/2022 0957   ALKPHOS 88 08/22/2022 0957   BILITOT 0.7 08/22/2022 0957   BILIDIR 0.5 (H) 08/17/2020 0826   IBILI 1.0 (H) 08/17/2020 0826      Component Value Date/Time   TSH 1.700 08/22/2022 0957   Nutritional Lab Results  Component Value Date   VD25OH 37.6 08/22/2022     ASSESSMENT AND PLAN  TREATMENT PLAN FOR OBESITY:  Recommended Dietary Goals  Gloria Kim is currently in the action stage of change. As such, her goal is to continue weight management plan. She has agreed to track and will review at next visit.  Behavioral Intervention  We discussed the following Behavioral Modification Strategies today: increasing lean protein intake to established goals, decreasing simple carbohydrates , increasing vegetables, avoiding skipping meals, increasing water intake , work on tracking and journaling calories using tracking application, planning for success, and continue to work on maintaining a reduced calorie state, getting the recommended amount of protein, incorporating whole foods, making healthy choices, staying well hydrated and practicing mindfulness when eating..  Additional resources provided today: NA  Recommended Physical Activity Goals  Paytan has been advised to work up to 150 minutes of moderate intensity aerobic activity a week and strengthening exercises 2-3 times per week for cardiovascular health, weight loss maintenance and preservation of muscle mass.   She has agreed to Think about enjoyable ways to increase daily physical activity and overcoming barriers to exercise, Increase physical activity in their day and reduce sedentary time (increase NEAT)., and Work on scheduling and tracking physical activity.    Pharmacotherapy We discussed various medication options to help Mersadies with her weight loss efforts and we both agreed to continue [facility] 0.25mg . Side effects  discussed.  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  Will continue to monitor.  Morbid obesity (HCC)  BMI 50.0-59.9, adult (HCC)       Will obtain labs in 1-3 months  Return in about 3 weeks (around 06/06/2023).Marland Kitchen She was informed of the importance of frequent follow up visits to maximize her success with intensive lifestyle modifications for her multiple health conditions.   ATTESTASTION STATEMENTS:  Reviewed by clinician on day of visit: allergies, medications, problem list, medical history, surgical history, family history, social history, and previous encounter notes.   Time spent on visit including pre-visit chart review and post-visit care and charting was 30 minutes.    Theodis Sato. Adya Wirz FNP-C

## 2023-06-06 ENCOUNTER — Encounter: Payer: Self-pay | Admitting: Nurse Practitioner

## 2023-06-06 ENCOUNTER — Ambulatory Visit (INDEPENDENT_AMBULATORY_CARE_PROVIDER_SITE_OTHER): Payer: 59 | Admitting: Nurse Practitioner

## 2023-06-06 VITALS — BP 122/80 | HR 68 | Temp 97.6°F | Ht 62.0 in | Wt 267.0 lb

## 2023-06-06 DIAGNOSIS — R7303 Prediabetes: Secondary | ICD-10-CM | POA: Diagnosis not present

## 2023-06-06 DIAGNOSIS — E66813 Obesity, class 3: Secondary | ICD-10-CM

## 2023-06-06 DIAGNOSIS — Z6841 Body Mass Index (BMI) 40.0 and over, adult: Secondary | ICD-10-CM | POA: Diagnosis not present

## 2023-06-06 MED ORDER — METFORMIN HCL 500 MG PO TABS
500.0000 mg | ORAL_TABLET | Freq: Two times a day (BID) | ORAL | 0 refills | Status: DC
Start: 2023-06-06 — End: 2023-07-05

## 2023-06-06 MED ORDER — WEGOVY 0.25 MG/0.5ML ~~LOC~~ SOAJ
0.2500 mg | SUBCUTANEOUS | 0 refills | Status: DC
Start: 2023-06-06 — End: 2023-07-04

## 2023-06-06 NOTE — Progress Notes (Signed)
Office: [phone]  /  Fax: [phone]  WEIGHT SUMMARY AND BIOMETRICS  Weight Lost Since Last Visit: 7lb  Weight Gained Since Last Visit: 0lb   Vitals Temp: 97.6 F (36.4 C) BP: 122/80 Pulse Rate: 68 SpO2: 98 %   Anthropometric Measurements Height: 5\' 2"  (1.575 m) Weight: 267 lb (121.1 kg) BMI (Calculated): 48.82 Weight at Last Visit: 274lb Weight Lost Since Last Visit: 7lb Weight Gained Since Last Visit: 0lb Starting Weight: 283lb Total Weight Loss (lbs): 16 lb (7.258 kg)   Body Composition  Body Fat %: 52.7 % Fat Mass (lbs): 1410 lbs Muscle Mass (lbs): 120 lbs Total Body Water (lbs): 94.8 lbs Visceral Fat Rating : 18   Other Clinical Data Fasting: Yes Today's Visit #: 8 Starting Date: 08/22/22     HPI  Chief Complaint: OBESITY  Gloria Kim is here to discuss her progress with her obesity treatment plan. She is on the the Category 3 Plan and states she is following her eating plan approximately 50 % of the time. She states she is exercising 0 minutes 0 days per week.   Interval History:  Since last office visit she has lost 7 pounds.  She is watching her portion sizes and is making healthier choices. She is aiming to eat more protein.  She is drinking water.    Her daughter is currently in the hospital due to seizures.    Pharmacotherapy for weight loss: She is currently taking Wegovy 0.25 mg for medical weight loss.  Reports some fatigue.  Overall doing well.     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 [name] Campanile.  Her highest weight prior to surgery was 340 lbs and her nadir weight after surgery was 264 lbs. She is taking a MV (bariatric vitamin) with iron once daily and Vit D with calcium TID.  Prediabetes Last A1c was 5.8  Medication(s): Metformin 500mg   BID.  Denies side effects Polyphagia:Yes Lab Results  Component Value Date   HGBA1C 5.8 (H) 08/22/2022   HGBA1C 6.3 (H) 06/30/2020   Lab Results  Component Value Date   INSULIN 12.6 08/22/2022     PHYSICAL EXAM:  Blood pressure 122/80, pulse 68, temperature 97.6 F (36.4 C), height 5\' 2"  (1.575 m), weight 267 lb (121.1 kg), last menstrual period 05/09/2023, SpO2 98%. Body mass index is 48.83 kg/m.  General: She is overweight, cooperative, alert, well developed, and in no acute distress. PSYCH: Has normal mood, affect and thought process.   Extremities: No edema.  Neurologic: No gross sensory or motor deficits. No tremors or fasciculations noted.    DIAGNOSTIC DATA REVIEWED:  BMET    Component Value Date/Time   NA 136 08/22/2022 0957   K 4.6 08/22/2022 0957   CL 99 08/22/2022 0957   CO2 23 08/22/2022 0957   GLUCOSE 91 08/22/2022 0957   GLUCOSE 120 (H) 09/11/2021 0113   BUN 11 08/22/2022 0957   CREATININE 0.83 08/22/2022 0957   CALCIUM 9.1 08/22/2022 0957   GFRNONAA >60 09/11/2021 0113   GFRAA >60 09/08/2014 2006   Lab Results  Component Value Date   HGBA1C 5.8 (H) 08/22/2022   HGBA1C 6.3 (H) 06/30/2020   Lab Results  Component Value Date   INSULIN 12.6 08/22/2022   Lab Results  Component Value Date   TSH 1.700 08/22/2022  CBC    Component Value Date/Time   WBC 9.4 09/11/2021 0113   RBC 4.16 09/11/2021 0113   HGB 12.7 09/11/2021 0113   HCT 39.5 09/11/2021 0113   PLT 243 09/11/2021 0113   MCV 95.0 09/11/2021 0113   MCH 30.5 09/11/2021 0113   MCHC 32.2 09/11/2021 0113   RDW 14.2 09/11/2021 0113   Iron Studies No results found for: "IRON", "TIBC", "FERRITIN", "IRONPCTSAT" Lipid Panel     Component Value Date/Time   CHOL 149 08/22/2022 0957   TRIG 78 08/22/2022 0957   HDL 69 08/22/2022 0957   LDLCALC 65 08/22/2022 0957   Hepatic Function Panel     Component Value Date/Time   PROT 7.1 08/22/2022 0957   ALBUMIN 4.2 08/22/2022 0957   AST 19  08/22/2022 0957   ALT 15 08/22/2022 0957   ALKPHOS 88 08/22/2022 0957   BILITOT 0.7 08/22/2022 0957   BILIDIR 0.5 (H) 08/17/2020 0826   IBILI 1.0 (H) 08/17/2020 0826      Component Value Date/Time   TSH 1.700 08/22/2022 0957   Nutritional Lab Results  Component Value Date   VD25OH 37.6 08/22/2022     ASSESSMENT AND PLAN  TREATMENT PLAN FOR OBESITY:  Recommended Dietary Goals  Aldine is currently in the action stage of change. As such, her goal is to continue weight management plan. She has agreed to the Category 3 Plan.  Behavioral Intervention  We discussed the following Behavioral Modification Strategies today: increasing lean protein intake to established goals, decreasing simple carbohydrates , increasing vegetables, work on meal planning and preparation, keeping healthy foods at home, continue to work on implementation of reduced calorie nutritional plan, continue to practice mindfulness when eating, planning for success, and continue to work on maintaining a reduced calorie state, getting the recommended amount of protein, incorporating whole foods, making healthy choices, staying well hydrated and practicing mindfulness when eating..  Additional resources provided today: NA  Recommended Physical Activity Goals  Ruthann has been advised to work up to 150 minutes of moderate intensity aerobic activity a week and strengthening exercises 2-3 times per week for cardiovascular health, weight loss maintenance and preservation of muscle mass.   She has agreed to Think about enjoyable ways to increase daily physical activity and overcoming barriers to exercise, Increase physical activity in their day and reduce sedentary time (increase NEAT)., and Work on scheduling and tracking physical activity.    Pharmacotherapy We discussed various medication options to help Shakea with her weight loss efforts and we both agreed to continue [facility] 0.25mg .  Side effects  discussed.  ASSOCIATED CONDITIONS ADDRESSED TODAY  Action/Plan  Pre-diabetes -     Continue metFORMIN HCl; Take 1 tablet (500 mg total) by mouth 2 (two) times daily with a meal.  Dispense: 60 tablet; Refill: 0.  Side effects discussed.   Class 3 severe obesity due to excess calories with serious comorbidity and body mass index (BMI) of 45.0 to 49.9 in adult [facility]) -     QMVHQI; Inject 0.25 mg into the skin once a week.  Dispense: 2 mL; Refill: 0     Bioimpedance reviewed with patient today.  Body fat percentage decreased, muscle mass increased and fat mass in pounds decreased.  Will obtain labs in May.     Return in about 4 weeks (around 07/04/2023).Marland Kitchen She was informed of the importance of frequent follow up visits to maximize her success with intensive lifestyle modifications for her multiple health conditions.   ATTESTASTION STATEMENTS:  Reviewed by clinician on day of visit: allergies, medications, problem list, medical history, surgical history, family history, social history, and previous encounter notes.    Theodis Sato. Jourdon Zimmerle FNP-C

## 2023-06-27 ENCOUNTER — Ambulatory Visit: Payer: 59 | Admitting: Nurse Practitioner

## 2023-07-04 ENCOUNTER — Other Ambulatory Visit: Payer: Self-pay | Admitting: Nurse Practitioner

## 2023-07-04 ENCOUNTER — Encounter: Payer: Self-pay | Admitting: Nurse Practitioner

## 2023-07-04 ENCOUNTER — Ambulatory Visit (INDEPENDENT_AMBULATORY_CARE_PROVIDER_SITE_OTHER): Admitting: Nurse Practitioner

## 2023-07-04 VITALS — BP 133/84 | HR 86 | Temp 97.6°F | Ht 62.0 in | Wt 270.0 lb

## 2023-07-04 DIAGNOSIS — R632 Polyphagia: Secondary | ICD-10-CM

## 2023-07-04 DIAGNOSIS — Z6841 Body Mass Index (BMI) 40.0 and over, adult: Secondary | ICD-10-CM

## 2023-07-04 DIAGNOSIS — E66813 Obesity, class 3: Secondary | ICD-10-CM

## 2023-07-04 DIAGNOSIS — R7303 Prediabetes: Secondary | ICD-10-CM

## 2023-07-04 MED ORDER — WEGOVY 0.5 MG/0.5ML ~~LOC~~ SOAJ
0.5000 mg | SUBCUTANEOUS | 0 refills | Status: DC
Start: 2023-07-04 — End: 2023-08-01

## 2023-07-04 NOTE — Progress Notes (Signed)
 Office: [phone]  /  Fax: [phone]  WEIGHT SUMMARY AND BIOMETRICS  Weight Lost Since Last Visit: 0lb  Weight Gained Since Last Visit: 3lb   Vitals Temp: 97.6 F (36.4 C) BP: 133/84 Pulse Rate: 86 SpO2: 97 %   Anthropometric Measurements Height: 5\' 2"  (1.575 m) Weight: 270 lb (122.5 kg) BMI (Calculated): 49.37 Weight at Last Visit: 267lb Weight Lost Since Last Visit: 0lb Weight Gained Since Last Visit: 3lb Starting Weight: 283lb Total Weight Loss (lbs): 13 lb (5.897 kg)   Body Composition  Body Fat %: 55.3 % Fat Mass (lbs): 149.6 lbs Muscle Mass (lbs): 114.8 lbs Visceral Fat Rating : 20   Other Clinical Data Fasting: No Labs: No Today's Visit #: 9 Starting Date: 08/22/22     HPI  Chief Complaint: OBESITY  Gloria Kim is here to discuss her progress with her obesity treatment plan. She is on the the Category 3 Plan and states she is following her eating plan approximately 50 % of the time. She states she is exercising 0 minutes 0 days per week.   Interval History:  Since last office visit she has gained 3 pounds. She is tracking am meals but is not tracking all day.  "In my mind I'm keeping tabs".  She reports eating the same things all the time.   She is struggling with B knee pain.  She is walking and active at work.  She struggles with increasing her exercise due to pain.  + polyphagia.    BF:  1.5 eggs and toast and sometimes a protein shake Snack:  fruit-tries to avoid bananas Lunch:  tuna, pickle, lettuce with crackers Snack:  fruit or popcorn Dinner:  protein, vegetables Snack:  sometimes mixed nuts, yasso bars and chips Drinks:  water, coffee, G2   Pharmacotherapy for weight loss: She is currently taking Wegovy 0.25 mg for medical weight loss.  Reports some fatigue.      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 [name] Campanile.  Her highest weight prior to surgery was 340 lbs and her nadir weight after surgery was 264 lbs. She is taking a MV (bariatric vitamin) with iron once daily and Vit D with calcium TID.   PHYSICAL EXAM:  Blood pressure 133/84, pulse 86, temperature 97.6 F (36.4 C), height 5\' 2"  (1.575 m), weight 270 lb (122.5 kg), last menstrual period 07/02/2023, SpO2 97%. Body mass index is 49.38 kg/m.  General: She is overweight, cooperative, alert, well developed, and in no acute distress. PSYCH: Has normal mood, affect and thought process.   Extremities: No edema.  Neurologic: No gross sensory or motor deficits. No tremors or fasciculations noted.    DIAGNOSTIC DATA REVIEWED:  BMET    Component Value Date/Time   NA 136 08/22/2022 0957   K 4.6 08/22/2022 0957   CL 99 08/22/2022 0957   CO2 23 08/22/2022 0957   GLUCOSE 91 08/22/2022 0957   GLUCOSE 120 (H) 09/11/2021 0113   BUN 11 08/22/2022 0957   CREATININE 0.83 08/22/2022 0957   CALCIUM 9.1 08/22/2022 0957   GFRNONAA >60 09/11/2021 0113   GFRAA >60 09/08/2014 2006   Lab Results  Component Value Date   HGBA1C 5.8 (H) 08/22/2022   HGBA1C 6.3 (H) 06/30/2020   Lab Results  Component Value Date   INSULIN 12.6  08/22/2022   Lab Results  Component Value Date   TSH 1.700 08/22/2022   CBC    Component Value Date/Time   WBC 9.4 09/11/2021 0113   RBC 4.16 09/11/2021 0113   HGB 12.7 09/11/2021 0113   HCT 39.5 09/11/2021 0113   PLT 243 09/11/2021 0113   MCV 95.0 09/11/2021 0113   MCH 30.5 09/11/2021 0113   MCHC 32.2 09/11/2021 0113   RDW 14.2 09/11/2021 0113   Iron Studies No results found for: "IRON", "TIBC", "FERRITIN", "IRONPCTSAT" Lipid Panel     Component Value Date/Time   CHOL 149 08/22/2022 0957   TRIG 78 08/22/2022 0957   HDL 69 08/22/2022 0957   LDLCALC 65 08/22/2022 0957   Hepatic Function Panel     Component Value Date/Time   PROT 7.1  08/22/2022 0957   ALBUMIN 4.2 08/22/2022 0957   AST 19 08/22/2022 0957   ALT 15 08/22/2022 0957   ALKPHOS 88 08/22/2022 0957   BILITOT 0.7 08/22/2022 0957   BILIDIR 0.5 (H) 08/17/2020 0826   IBILI 1.0 (H) 08/17/2020 0826      Component Value Date/Time   TSH 1.700 08/22/2022 0957   Nutritional Lab Results  Component Value Date   VD25OH 37.6 08/22/2022     ASSESSMENT AND PLAN  TREATMENT PLAN FOR OBESITY:  Recommended Dietary Goals  Gloria Kim is currently in the action stage of change. As such, her goal is to continue weight management plan. She has agreed to the Category 3 Plan.  Behavioral Intervention  We discussed the following Behavioral Modification Strategies today: increasing lean protein intake to established goals, decreasing simple carbohydrates , increasing vegetables, increasing water intake , work on meal planning and preparation, reading food labels , keeping healthy foods at home, continue to work on implementation of reduced calorie nutritional plan, continue to practice mindfulness when eating, and continue to work on maintaining a reduced calorie state, getting the recommended amount of protein, incorporating whole foods, making healthy choices, staying well hydrated and practicing mindfulness when eating..  Additional resources provided today: NA  Recommended Physical Activity Goals  Gloria Kim has been advised to work up to 150 minutes of moderate intensity aerobic activity a week and strengthening exercises 2-3 times per week for cardiovascular health, weight loss maintenance and preservation of muscle mass.   She has agreed to Think about enjoyable ways to increase daily physical activity and overcoming barriers to exercise, Increase physical activity in their day and reduce sedentary time (increase NEAT)., and Work on scheduling and tracking physical activity.    Pharmacotherapy We discussed various medication options to help Gloria Kim with her weight loss  efforts and we both agreed to increase Wegovy 0.5mg .  Side effects discussed.  ASSOCIATED CONDITIONS ADDRESSED TODAY  Action/Plan  Polyphagia -     BMWUXL; Inject 0.5 mg into the skin once a week.  Dispense: 2 mL; Refill: 0  Class 3 severe obesity due to excess calories with serious comorbidity and body mass index (BMI) of 45.0 to 49.9 in adult [facility]) -     KGMWNU; Inject 0.5 mg into the skin once a week.  Dispense: 2 mL; Refill: 0       Will obtain labs in May.   Return in about 4 weeks (around 08/01/2023).Marland Kitchen She was informed of the importance of frequent follow up visits to maximize her success with intensive lifestyle modifications for her multiple health conditions.   ATTESTASTION STATEMENTS:  Reviewed by clinician on day of visit: allergies, medications, problem list, medical  history, surgical history, family history, social history, and previous encounter notes.     Theodis Sato. Nisha Dhami FNP-C

## 2023-07-27 ENCOUNTER — Emergency Department

## 2023-07-27 ENCOUNTER — Emergency Department
Admission: EM | Admit: 2023-07-27 | Discharge: 2023-07-27 | Disposition: A | Discharge status: Home / Self Care | Attending: Emergency Medicine | Admitting: Emergency Medicine

## 2023-07-27 ENCOUNTER — Other Ambulatory Visit: Payer: Self-pay

## 2023-07-27 DIAGNOSIS — R1012 Left upper quadrant pain: Secondary | ICD-10-CM | POA: Diagnosis present

## 2023-07-27 DIAGNOSIS — E119 Type 2 diabetes mellitus without complications: Secondary | ICD-10-CM | POA: Diagnosis not present

## 2023-07-27 LAB — CBC
HCT: 39.4 % (ref 36.0–46.0)
Hemoglobin: 12.7 g/dL (ref 12.0–15.0)
MCH: 30.8 pg (ref 26.0–34.0)
MCHC: 32.2 g/dL (ref 30.0–36.0)
MCV: 95.4 fL (ref 80.0–100.0)
Platelets: 264 10*3/uL (ref 150–400)
RBC: 4.13 MIL/uL (ref 3.87–5.11)
RDW: 13.9 % (ref 11.5–15.5)
WBC: 6.9 10*3/uL (ref 4.0–10.5)
nRBC: 0 % (ref 0.0–0.2)

## 2023-07-27 LAB — COMPREHENSIVE METABOLIC PANEL WITH GFR
ALT: 14 U/L (ref 0–44)
AST: 19 U/L (ref 15–41)
Albumin: 3.7 g/dL (ref 3.5–5.0)
Alkaline Phosphatase: 59 U/L (ref 38–126)
Anion gap: 10 (ref 5–15)
BUN: 14 mg/dL (ref 6–20)
CO2: 23 mmol/L (ref 22–32)
Calcium: 9.2 mg/dL (ref 8.9–10.3)
Chloride: 103 mmol/L (ref 98–111)
Creatinine, Ser: 0.83 mg/dL (ref 0.44–1.00)
GFR, Estimated: >60 mL/min (ref >60)
Glucose, Bld: 79 mg/dL (ref 70–99)
Potassium: 3.9 mmol/L (ref 3.5–5.1)
Sodium: 136 mmol/L (ref 135–145)
Total Bilirubin: 0.8 mg/dL (ref 0.0–1.2)
Total Protein: 7.2 g/dL (ref 6.5–8.1)

## 2023-07-27 LAB — URINALYSIS, ROUTINE W REFLEX MICROSCOPIC
Bilirubin Urine: NEGATIVE
Bilirubin Urine: NEGATIVE
Glucose, UA: NEGATIVE mg/dL
Glucose, UA: NEGATIVE mg/dL
Hgb urine dipstick: NEGATIVE
Hgb urine dipstick: NEGATIVE
Ketones, ur: 20 mg/dL — AB
Ketones, ur: NEGATIVE mg/dL
Leukocytes,Ua: NEGATIVE
Leukocytes,Ua: NEGATIVE
Nitrite: NEGATIVE
Nitrite: NEGATIVE
Protein, ur: NEGATIVE mg/dL
Protein, ur: NEGATIVE mg/dL
Specific Gravity, Urine: 1.004 — ABNORMAL LOW (ref 1.005–1.030)
Specific Gravity, Urine: 1.023 (ref 1.005–1.030)
pH: 6 (ref 5.0–8.0)
pH: 6 (ref 5.0–8.0)

## 2023-07-27 LAB — LIPASE, BLOOD: Lipase: 25 U/L (ref 11–51)

## 2023-07-27 LAB — POC URINE PREG, ED: Preg Test, Ur: NEGATIVE

## 2023-07-27 MED ORDER — IOHEXOL 300 MG/ML  SOLN
100.0000 mL | Freq: Once | INTRAMUSCULAR | Status: AC | PRN
  Administered 2023-07-27: 100 mL via INTRAVENOUS

## 2023-07-27 MED ORDER — ONDANSETRON HCL 4 MG/2ML IJ SOLN
4.0000 mg | INTRAMUSCULAR | Status: AC
  Administered 2023-07-27: 4 mg via INTRAVENOUS
  Filled 2023-07-27: qty 2

## 2023-07-27 MED ORDER — MORPHINE SULFATE (PF) 4 MG/ML IV SOLN
4.0000 mg | Freq: Once | INTRAVENOUS | Status: AC
  Administered 2023-07-27: 4 mg via INTRAVENOUS
  Filled 2023-07-27: qty 1

## 2023-07-27 NOTE — Discharge Instructions (Signed)
 You were seen in the emergency room for abdominal pain. It is important that you follow up closely with your primary care doctor in the next couple of days.  If you're unable to see her primary care doctor you may return to the emergency room or go to the [place] walk-in clinic in 1 or 2 days for reexam.  Please return to the emergency room right away if you are to develop a fever, severe nausea, your pain becomes severe or worsens, you are unable to keep food down, begin vomiting any dark or bloody fluid, you develop any dark or bloody stools, feel dehydrated, or other new concerns or symptoms arise.

## 2023-07-27 NOTE — ED Triage Notes (Addendum)
 Pt to ED via [facility] from work. Pt reports left flank pain x1 hr. Pt reports pain feels like contractions. Pt denies N/V/D or urinary symptoms. Pt reports hx of kidney stones. Pt hypertensive in triage. Pt reports hx of HTN but has not been placed on meds since gastric bypass surgery in 2022. Pt reports pain earlier felt like it was "ripping". Pt BP in left arm 200/117 and RA 117/68  EMS VS: 156/90 98 temp 99 HR  CBG 89

## 2023-07-27 NOTE — ED Notes (Signed)
 Lab notified that 1245 Urinalysis results are not this patient's urine results, as she reports she has not given a specimen. Lab tech to speak with their manager to get removed from chart.

## 2023-07-27 NOTE — ED Provider Notes (Signed)
 [facility] Provider Note    Event Date/Time   First MD Initiated Contact with Patient 07/27/23 1259     (approximate)   History   Flank Pain   HPI  Gloria Kim is a 48 y.o. 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  She was at work today sitting she started having achy pain discomfort in her mid upper left quadrant radiates slightly to her back  He has no abdominal pain there is no nausea or vomiting.  There is no fevers or chills.  No pain or burning with urination.  Denies pregnancy.No chest pain or problems breathing.     Physical Exam   Triage Vital Signs: ED Triage Vitals  Encounter Vitals Group     BP 07/27/23 1228 (!) 200/117     Systolic BP Percentile --      Diastolic BP Percentile --      Pulse Rate 07/27/23 1227 70     Resp 07/27/23 1227 20     Temp 07/27/23 1227 97.8 F (36.6 C)     Temp Source 07/27/23 1227 Oral     SpO2 07/27/23 1227 100 %     Weight 07/27/23 1225 264 lb (119.7 kg)     Height 07/27/23 1225 5\' 2"  (1.575 m)     Head Circumference --      Peak Flow --      Pain Score 07/27/23 1225 9     Pain Loc --      Pain Education --      Exclude from Growth Chart --     Most recent vital signs: Vitals:   07/27/23 1343 07/27/23 1430  BP: 113/69 116/69  Pulse: 77 (!) 56  Resp: 15 17  Temp:    SpO2: 98% 99%     General: Awake, no distress.  No acute distress very pleasant.  Her wife is at the bedside CV:  Good peripheral perfusion.  Resp:  Normal effort.  Clear bilateral Abd:  No distention.  Soft, no distention.  Reports tenderness in the mid left abdomen and left upper quadrant no rebound or guarding.  No epigastric pain negative right upper quadrant or lower abdominal pain.  No rashes or lesions Other:  Nontoxic  well-appearing but having some pain primarily in the left upper quadrant.  No chest pain     ED Results / Procedures / Treatments   Labs (all labs ordered are listed, but only abnormal results are displayed) Labs Reviewed  URINALYSIS, ROUTINE W REFLEX MICROSCOPIC - Abnormal; Notable for the following components:      Result Value   Color, Urine STRAW (*)    APPearance CLEAR (*)    Specific Gravity, Urine 1.004 (*)    Ketones, ur 20 (*)    All other components within normal limits  URINALYSIS, ROUTINE W REFLEX MICROSCOPIC - Abnormal; Notable for the following components:   Color, Urine YELLOW (*)    APPearance HAZY (*)    All other components within normal limits  LIPASE, BLOOD  COMPREHENSIVE METABOLIC PANEL WITH GFR  CBC  POC URINE PREG, ED   Labs reviewed including negative pregnancy test negative.  Analysis, overall very reassuring.  No leukocytosis no transaminitis or evidence of pancreatitis.  EKG     RADIOLOGY  CT  ABDOMEN PELVIS W CONTRAST Result Date: 07/27/2023 CLINICAL DATA:  LUQ abdominal pain prior gastric bypass with L upper abd pain. EXAM: CT ABDOMEN AND PELVIS WITH CONTRAST TECHNIQUE: Multidetector CT imaging of the abdomen and pelvis was performed using the standard protocol following bolus administration of intravenous contrast. RADIATION DOSE REDUCTION: This exam was performed according to the departmental dose-optimization program which includes automated exposure control, adjustment of the mA and/or kV according to patient size and/or use of iterative reconstruction technique. CONTRAST:  OMNIPAQUE IOHEXOL 300 MG/ML  SOLN COMPARISON:  CT scan abdomen and pelvis from 05/04/2023. FINDINGS: Lower chest: The lung bases are clear. No pleural effusion. The heart is normal in size. No pericardial effusion. Hepatobiliary: The liver is normal in size. Non-cirrhotic configuration. No suspicious mass. These is mild diffuse hepatic steatosis. No intrahepatic or  extrahepatic bile duct dilation. No calcified gallstones. Normal gallbladder wall thickness. No pericholecystic inflammatory changes. Pancreas: Unremarkable. No pancreatic ductal dilatation or surrounding inflammatory changes. Spleen: Within normal limits. No focal lesion. Adrenals/Urinary Tract: Adrenal glands are unremarkable. No suspicious renal mass. No hydronephrosis. No renal or ureteric calculi. Urinary bladder is under distended, precluding optimal assessment. However, no large mass or stones identified. No perivesical fat stranding. Stomach/Bowel: Postsurgical changes from prior gastric bypass noted. No disproportionate dilation of the small or large bowel loops. No evidence of abnormal bowel wall thickening or inflammatory changes. The appendix is unremarkable. Vascular/Lymphatic: No ascites or pneumoperitoneum. No abdominal or pelvic lymphadenopathy, by size criteria. No aneurysmal dilation of the major abdominal arteries. Reproductive: The uterus is unremarkable. No large adnexal mass. Other: Periumbilical and infraumbilical midline surgical scar noted. The soft tissues and abdominal wall are otherwise unremarkable. Musculoskeletal: No suspicious osseous lesions. There are mild multilevel degenerative changes in the visualized spine. IMPRESSION: *No acute inflammatory process identified within the abdomen or pelvis. *Multiple other nonacute observations, as described above. Electronically Signed   By: Jules Schick M.D.   On: 07/27/2023 15:24      PROCEDURES:  Critical Care performed: No  Procedures   MEDICATIONS ORDERED IN ED: Medications  morphine (PF) 4 MG/ML injection 4 mg (4 mg Intravenous Given 07/27/23 1340)  ondansetron (ZOFRAN) injection 4 mg (4 mg Intravenous Given 07/27/23 1340)  iohexol (OMNIPAQUE) 300 MG/ML solution 100 mL (100 mLs Intravenous Contrast Given 07/27/23 1349)     IMPRESSION / MDM / ASSESSMENT AND PLAN / ED COURSE  I reviewed the triage vital signs and the  nursing notes.                              Differential diagnosis includes but is not limited to, abdominal perforation, aortic dissection, cholecystitis, appendicitis, diverticulitis, colitis, esophagitis/gastritis, kidney stone, pyelonephritis, urinary tract infection, aortic aneurysm. All are considered in decision and treatment plan. Based upon the patient's presentation and risk factors, will proceed with CT imaging especially given history of previous gastric bypass.  Overall her labs and vital signs reassuring.  Initially presented with a single measure elevated blood pressure but this is normalized.  Her pain was initially at least moderate located primarily in the left upper quadrant but after receiving morphine in time her symptoms improved markedly.  Overall improved.  Suspect some type of acute self-limited causation at this point, very reassuring.  Reevaluated the patient after CT imaging reviewed her results with her.  She was instructed to start an over-the-counter PPI avoid NSAIDs and will follow-up closely with her bariatric surgery  team.  No associated acute cardiopulmonary vascular symptoms no obvious infectious etiologies.  Patient much improved.  Receptive to return precautions.  Return precautions and treatment recommendations and follow-up discussed with the patient who is agreeable with the plan.    Patient's presentation is most consistent with acute complicated illness / injury requiring diagnostic workup.          FINAL CLINICAL IMPRESSION(S) / ED DIAGNOSES   Final diagnoses:  LUQ abdominal pain     Rx / DC Orders   ED Discharge Orders     None        Note:  This document was prepared using Dragon voice recognition software and may include unintentional dictation errors.   Sharyn Creamer, MD 07/27/23 2006

## 2023-07-28 ENCOUNTER — Other Ambulatory Visit: Payer: Self-pay | Admitting: Nurse Practitioner

## 2023-07-28 DIAGNOSIS — R7303 Prediabetes: Secondary | ICD-10-CM

## 2023-08-01 ENCOUNTER — Ambulatory Visit: Admitting: Nurse Practitioner

## 2023-08-01 VITALS — BP 132/78 | HR 73 | Temp 97.9°F | Ht 62.0 in | Wt 266.0 lb

## 2023-08-01 DIAGNOSIS — E66813 Obesity, class 3: Secondary | ICD-10-CM

## 2023-08-01 DIAGNOSIS — R632 Polyphagia: Secondary | ICD-10-CM

## 2023-08-01 DIAGNOSIS — R7303 Prediabetes: Secondary | ICD-10-CM

## 2023-08-01 DIAGNOSIS — Z6841 Body Mass Index (BMI) 40.0 and over, adult: Secondary | ICD-10-CM

## 2023-08-01 DIAGNOSIS — R1084 Generalized abdominal pain: Secondary | ICD-10-CM

## 2023-08-01 MED ORDER — WEGOVY 0.5 MG/0.5ML ~~LOC~~ SOAJ: 0.5000 mg | SUBCUTANEOUS | 0 refills | Status: DC

## 2023-08-01 MED ORDER — METFORMIN HCL 500 MG PO TABS
500.0000 mg | ORAL_TABLET | Freq: Every day | ORAL | 0 refills | Status: DC
Start: 2023-08-01 — End: 2023-08-29

## 2023-08-01 NOTE — Progress Notes (Signed)
 Office: [phone]  /  Fax: [phone]  WEIGHT SUMMARY AND BIOMETRICS  Weight Lost Since Last Visit: 4lb  Weight Gained Since Last Visit: 0lb   Vitals Temp: 97.9 F (36.6 C) BP: 132/78 Pulse Rate: 73 SpO2: 95 %   Anthropometric Measurements Height: 5\' 2"  (1.575 m) Weight: 266 lb (120.7 kg) BMI (Calculated): 48.64 Weight at Last Visit: 270lb Weight Lost Since Last Visit: 4lb Weight Gained Since Last Visit: 0lb Starting Weight: 283lb Total Weight Loss (lbs): 17 lb (7.711 kg)   Body Composition  Body Fat %: 52.7 % Fat Mass (lbs): 140.2 lbs Muscle Mass (lbs): 119.6 lbs Total Body Water (lbs): 94.4 lbs Visceral Fat Rating : 18   Other Clinical Data Fasting: Yes Labs: No Today's Visit #: 10 Starting Date: 08/22/22     HPI  Chief Complaint: OBESITY  Gloria Kim is here to discuss her progress with her obesity treatment plan. She is on the the Category 3 Plan and states she is following her eating plan approximately 50 % of the time. She states she is walking daily.    Interval History:  Since last office visit she has lost 4 pounds.  She went to the ER on 07/27/23 due to abd pain, GERD, bloating, constipation, etc. CT abd 07/27/23. Can't tell if symptoms got worse with increase with Wegovy.   She saw Dr. [name] Kim last on 04/27/23.  EGD was recommended by ER.  Overall is feeling "a lot better then I was.  It is still sore on the left side".  Worse with eating, movement helps to make it better.  Denies fever, chills, vomiting.  Notes some nausea.    Pharmacotherapy for weight loss: She is currently taking Wegovy 0.5 mg for medical weight loss.  Reports some fatigue.      WEGOVY INJ is approved through 10/31/2023    Previous pharmacotherapy for medical weight loss:   Gloria Kim, Victoza & Metformin    Bariatric surgery:  Patient is status post laparoscopic Roux-en-y gastric bypass on 07/07/20 by Dr. [name] Kim.  Her highest weight prior to surgery was 340 lbs and her  nadir weight after surgery was 264 lbs. She is taking a MV (bariatric vitamin) with iron once daily and Vit D with calcium TID.  Prediabetes Last A1c was 5.8  Medication(s): Metformin 500mg  BID and Wegovy 0.5mg .   Polyphagia:Yes Lab Results  Component Value Date   HGBA1C 5.8 (H) 08/22/2022   HGBA1C 6.3 (H) 06/30/2020   Lab Results  Component Value Date   INSULIN 12.6 08/22/2022     PHYSICAL EXAM:  Blood pressure 132/78, pulse 73, temperature 97.9 F (36.6 C), height 5\' 2"  (1.575 m), weight 266 lb (120.7 kg), last menstrual period 07/02/2023, SpO2 95%. Body mass index is 48.65 kg/m.  General: She is overweight, cooperative, alert, well developed, and in no acute distress. PSYCH: Has normal mood, affect and thought process.   Extremities: No edema.  Neurologic: No gross sensory or motor deficits. No tremors or fasciculations noted.    DIAGNOSTIC DATA REVIEWED:  BMET    Component Value Date/Time   NA 136 07/27/2023 1227   NA 136 08/22/2022 0957   K 3.9 07/27/2023 1227   CL 103 07/27/2023 1227   CO2 23 07/27/2023 1227   GLUCOSE 79 07/27/2023 1227   BUN 14 07/27/2023 1227   BUN 11 08/22/2022 0957   CREATININE 0.83 07/27/2023 1227   CALCIUM 9.2 07/27/2023 1227   GFRNONAA >60 07/27/2023 1227   GFRAA >60 09/08/2014 2006  Lab Results  Component Value Date   HGBA1C 5.8 (H) 08/22/2022   HGBA1C 6.3 (H) 06/30/2020   Lab Results  Component Value Date   INSULIN 12.6 08/22/2022   Lab Results  Component Value Date   TSH 1.700 08/22/2022   CBC    Component Value Date/Time   WBC 6.9 07/27/2023 1227   RBC 4.13 07/27/2023 1227   HGB 12.7 07/27/2023 1227   HCT 39.4 07/27/2023 1227   PLT 264 07/27/2023 1227   MCV 95.4 07/27/2023 1227   MCH 30.8 07/27/2023 1227   MCHC 32.2 07/27/2023 1227   RDW 13.9 07/27/2023 1227   Iron Studies No results found for: "IRON", "TIBC", "FERRITIN", "IRONPCTSAT" Lipid Panel     Component Value Date/Time   CHOL 149 08/22/2022 0957    TRIG 78 08/22/2022 0957   HDL 69 08/22/2022 0957   LDLCALC 65 08/22/2022 0957   Hepatic Function Panel     Component Value Date/Time   PROT 7.2 07/27/2023 1227   PROT 7.1 08/22/2022 0957   ALBUMIN 3.7 07/27/2023 1227   ALBUMIN 4.2 08/22/2022 0957   AST 19 07/27/2023 1227   ALT 14 07/27/2023 1227   ALKPHOS 59 07/27/2023 1227   BILITOT 0.8 07/27/2023 1227   BILITOT 0.7 08/22/2022 0957   BILIDIR 0.5 (H) 08/17/2020 0826   IBILI 1.0 (H) 08/17/2020 0826      Component Value Date/Time   TSH 1.700 08/22/2022 0957   Nutritional Lab Results  Component Value Date   VD25OH 37.6 08/22/2022     ASSESSMENT AND PLAN  TREATMENT PLAN FOR OBESITY:  Recommended Dietary Goals  Gloria Kim is currently in the action stage of change. As such, her goal is to continue weight management plan. She has agreed to the Category 3 Plan.  Behavioral Intervention  We discussed the following Behavioral Modification Strategies today: increasing lean protein intake to established goals, decreasing simple carbohydrates , increasing vegetables, increasing fiber rich foods, increasing water intake , reading food labels , keeping healthy foods at home, and continue to work on maintaining a reduced calorie state, getting the recommended amount of protein, incorporating whole foods, making healthy choices, staying well hydrated and practicing mindfulness when eating..  Additional resources provided today: NA  Recommended Physical Activity Goals  Gloria Kim has been advised to work up to 150 minutes of moderate intensity aerobic activity a week and strengthening exercises 2-3 times per week for cardiovascular health, weight loss maintenance and preservation of muscle mass.   She has agreed to Think about enjoyable ways to increase daily physical activity and overcoming barriers to exercise, Increase physical activity in their day and reduce sedentary time (increase NEAT)., Start strengthening exercises with a goal of  2-3 sessions a week , and Increase the intensity, frequency or duration of aerobic exercises     Pharmacotherapy We discussed various medication options to help Gloria Kim with her weight loss efforts and we both agreed to continue [facility] 0.5mg . Side effects discussed.  ASSOCIATED CONDITIONS ADDRESSED TODAY  Action/Plan  Generalized abdominal pain -     Ambulatory referral to Gastroenterology  Pre-diabetes -     Decrease metFORMIN HCl; Take 1 tablet (500 mg total) by mouth daily with breakfast.  Dispense: 30 tablet; Refill: 0. Will send me a message next week to let me know how she is doing.  If she sees improvement with decreasing Metformin but still notes pain, will stop Metformin.    Polyphagia -     Continue [id number]; Inject 0.5 mg into  the skin once a week.  Dispense: 2 mL; Refill: 0. Will not increase at this time.  Will continue to monitor abd pain.   Class 3 severe obesity due to excess calories with serious comorbidity and body mass index (BMI) of 45.0 to 49.9 in adult [facility]) -     [id number]; Inject 0.5 mg into the skin once a week.  Dispense: 2 mL; Refill: 0     Will obtain fasting labs at next visit    Return in about 4 weeks (around 08/29/2023).Aaron Aas She was informed of the importance of frequent follow up visits to maximize her success with intensive lifestyle modifications for her multiple health conditions.   ATTESTASTION STATEMENTS:  Reviewed by clinician on day of visit: allergies, medications, problem list, medical history, surgical history, family history, social history, and previous encounter notes.    Crist Dominion. Dayvon Dax FNP-C

## 2023-08-14 ENCOUNTER — Encounter: Payer: Self-pay | Admitting: Nurse Practitioner

## 2023-08-15 ENCOUNTER — Encounter: Payer: Self-pay | Admitting: Nurse Practitioner

## 2023-08-29 ENCOUNTER — Encounter: Payer: Self-pay | Admitting: Nurse Practitioner

## 2023-08-29 ENCOUNTER — Ambulatory Visit (INDEPENDENT_AMBULATORY_CARE_PROVIDER_SITE_OTHER): Admitting: Nurse Practitioner

## 2023-08-29 VITALS — BP 122/83 | HR 74 | Temp 98.0°F | Ht 62.0 in | Wt 268.0 lb

## 2023-08-29 DIAGNOSIS — E66813 Obesity, class 3: Secondary | ICD-10-CM

## 2023-08-29 DIAGNOSIS — R1084 Generalized abdominal pain: Secondary | ICD-10-CM | POA: Diagnosis not present

## 2023-08-29 DIAGNOSIS — Z6841 Body Mass Index (BMI) 40.0 and over, adult: Secondary | ICD-10-CM

## 2023-08-29 DIAGNOSIS — R7303 Prediabetes: Secondary | ICD-10-CM | POA: Diagnosis not present

## 2023-08-29 MED ORDER — WEGOVY 1 MG/0.5ML ~~LOC~~ SOAJ
1.0000 mg | SUBCUTANEOUS | 0 refills | Status: DC
Start: 2023-08-29 — End: 2023-09-25

## 2023-08-29 NOTE — Progress Notes (Signed)
 Office: [phone]  /  Fax: [phone]  WEIGHT SUMMARY AND BIOMETRICS  Weight Lost Since Last Visit: 0lb  Weight Gained Since Last Visit: 2lb   Vitals Temp: 98 F (36.7 C) BP: 122/83 Pulse Rate: 74 SpO2: 98 %   Anthropometric Measurements Height: 5\' 2"  (1.575 m) Weight: 268 lb (121.6 kg) BMI (Calculated): 49.01 Weight at Last Visit: 266lb Weight Lost Since Last Visit: 0lb Weight Gained Since Last Visit: 2lb Starting Weight: 283lb Total Weight Loss (lbs): 15 lb (6.804 kg)   Body Composition  Body Fat %: 53.5 % Fat Mass (lbs): 143.4 lbs Muscle Mass (lbs): 118.4 lbs Total Body Water  (lbs): 97.2 lbs Visceral Fat Rating : 19   Other Clinical Data Fasting: Yes Labs: No Today's Visit #: 11 Starting Date: 08/22/22     HPI  Chief Complaint: OBESITY  Gloria Kim is here to discuss her progress with her obesity treatment plan. She is on the the Category 3 Plan and states she is following her eating plan approximately 50 % of the time. She states she is exercising 0 minutes 0 days per week.   Interval History:  Since last office visit she has gained 2 pounds. She is not skipping meals.  She is drinking water , G2 and coffee. She is not exercising.  She states she needs to find time to exercise.    Pharmacotherapy for weight loss: She is currently taking Wegovy  0.5 mg for medical weight loss.  Reports some fatigue.  Constipation has improved.      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 Elvan Hamel.  Her highest weight prior to surgery was 340 lbs and her nadir weight after surgery was 264 lbs. She is taking a MV (bariatric vitamin) with iron once daily and Vit D with calcium TID.  Prediabetes Last A1c was 5.8  Medication(s): Metformin  500mg  once daily (decreased after last  visit-pain has improved since decreasing dose) and Wegovy  0.5mg .  Polyphagia:Yes Lab Results  Component Value Date   HGBA1C 5.8 (H) 08/22/2022   HGBA1C 6.3 (H) 06/30/2020   Lab Results  Component Value Date   INSULIN  12.6 08/22/2022   Generalized obesity Has appt with GI on 10/10/23 Feels due to constipation Has improved since decreasing Metformin   PHYSICAL EXAM:  Blood pressure 122/83, pulse 74, temperature 98 F (36.7 C), height 5\' 2"  (1.575 m), weight 268 lb (121.6 kg), last menstrual period 08/27/2023, SpO2 98%. Body mass index is 49.02 kg/m.  General: She is overweight, cooperative, alert, well developed, and in no acute distress. PSYCH: Has normal mood, affect and thought process.   Extremities: No edema.  Neurologic: No gross sensory or motor deficits. No tremors or fasciculations noted.    DIAGNOSTIC DATA REVIEWED:  BMET    Component Value Date/Time   NA 136 07/27/2023 1227   NA 136 08/22/2022 0957   K 3.9 07/27/2023 1227   CL 103 07/27/2023 1227   CO2 23 07/27/2023 1227   GLUCOSE 79 07/27/2023 1227   BUN 14 07/27/2023 1227   BUN 11 08/22/2022 0957   CREATININE 0.83 07/27/2023 1227   CALCIUM 9.2 07/27/2023 1227   GFRNONAA >60 07/27/2023 1227   GFRAA >60 09/08/2014 2006   Lab Results  Component Value Date   HGBA1C 5.8 (H) 08/22/2022   HGBA1C 6.3 (H) 06/30/2020  Lab Results  Component Value Date   INSULIN  12.6 08/22/2022   Lab Results  Component Value Date   TSH 1.700 08/22/2022   CBC    Component Value Date/Time   WBC 6.9 07/27/2023 1227   RBC 4.13 07/27/2023 1227   HGB 12.7 07/27/2023 1227   HCT 39.4 07/27/2023 1227   PLT 264 07/27/2023 1227   MCV 95.4 07/27/2023 1227   MCH 30.8 07/27/2023 1227   MCHC 32.2 07/27/2023 1227   RDW 13.9 07/27/2023 1227   Iron Studies No results found for: "IRON", "TIBC", "FERRITIN", "IRONPCTSAT" Lipid Panel     Component Value Date/Time   CHOL 149 08/22/2022 0957   TRIG 78 08/22/2022 0957   HDL 69  08/22/2022 0957   LDLCALC 65 08/22/2022 0957   Hepatic Function Panel     Component Value Date/Time   PROT 7.2 07/27/2023 1227   PROT 7.1 08/22/2022 0957   ALBUMIN 3.7 07/27/2023 1227   ALBUMIN 4.2 08/22/2022 0957   AST 19 07/27/2023 1227   ALT 14 07/27/2023 1227   ALKPHOS 59 07/27/2023 1227   BILITOT 0.8 07/27/2023 1227   BILITOT 0.7 08/22/2022 0957   BILIDIR 0.5 (H) 08/17/2020 0826   IBILI 1.0 (H) 08/17/2020 0826      Component Value Date/Time   TSH 1.700 08/22/2022 0957   Nutritional Lab Results  Component Value Date   VD25OH 37.6 08/22/2022     ASSESSMENT AND PLAN  TREATMENT PLAN FOR OBESITY:  Recommended Dietary Goals  Gloria Kim is currently in the action stage of change. As such, her goal is to continue weight management plan. She has agreed to to track and will review macros at next visit.  Helped to set up mynetdiary today.    Behavioral Intervention  We discussed the following Behavioral Modification Strategies today: increasing lean protein intake to established goals, decreasing simple carbohydrates , increasing vegetables, increasing fiber rich foods, increasing water  intake , work on meal planning and preparation, work on tracking and journaling calories using tracking application, and continue to work on maintaining a reduced calorie state, getting the recommended amount of protein, incorporating whole foods, making healthy choices, staying well hydrated and practicing mindfulness when eating..  Additional resources provided today: NA  Recommended Physical Activity Goals  Gloria Kim has been advised to work up to 150 minutes of moderate intensity aerobic activity a week and strengthening exercises 2-3 times per week for cardiovascular health, weight loss maintenance and preservation of muscle mass.   She has agreed to Think about enjoyable ways to increase daily physical activity and overcoming barriers to exercise, Increase physical activity in their day and  reduce sedentary time (increase NEAT)., and Work on scheduling and tracking physical activity.    Pharmacotherapy We discussed various medication options to help Gloria Kim with her weight loss efforts and we both agreed to increase Wegovy  1mg . Side effects discussed.  Will let me know if constipation gets worse.  We had a long discussion today about staying on 0.5mg  due to constipation.  She states that her constipation has improved and would like to increase dose.  We discussed side effects extensively.  To let me know next week how she is doing.   ASSOCIATED CONDITIONS ADDRESSED TODAY  Action/Plan  Pre-diabetes Stop Metformin .  To let me know how she is doing next week.    Generalized abdominal pain Keep appt with GI  Class 3 severe obesity due to excess calories with serious comorbidity and body mass index (BMI) of 45.0 to  49.9 in adult -     Wegovy ; Inject 1 mg into the skin once a week.  Dispense: 2 mL; Refill: 0  Will obtain labs at next visit       Return in about 4 weeks (around 09/26/2023).Aaron Aas She was informed of the importance of frequent follow up visits to maximize her success with intensive lifestyle modifications for her multiple health conditions.   ATTESTASTION STATEMENTS:  Reviewed by clinician on day of visit: allergies, medications, problem list, medical history, surgical history, family history, social history, and previous encounter notes.      Crist Dominion. Makita Blow FNP-C

## 2023-09-25 ENCOUNTER — Ambulatory Visit (INDEPENDENT_AMBULATORY_CARE_PROVIDER_SITE_OTHER): Admitting: Nurse Practitioner

## 2023-09-25 ENCOUNTER — Encounter: Payer: Self-pay | Admitting: Nurse Practitioner

## 2023-09-25 VITALS — BP 113/75 | HR 75 | Temp 97.6°F | Ht 62.0 in | Wt 263.0 lb

## 2023-09-25 DIAGNOSIS — Z6841 Body Mass Index (BMI) 40.0 and over, adult: Secondary | ICD-10-CM

## 2023-09-25 DIAGNOSIS — E66813 Obesity, class 3: Secondary | ICD-10-CM

## 2023-09-25 DIAGNOSIS — K912 Postsurgical malabsorption, not elsewhere classified: Secondary | ICD-10-CM

## 2023-09-25 DIAGNOSIS — R7303 Prediabetes: Secondary | ICD-10-CM

## 2023-09-25 MED ORDER — WEGOVY 1.7 MG/0.75ML ~~LOC~~ SOAJ: 1.7000 mg | SUBCUTANEOUS | 0 refills | Status: DC

## 2023-09-25 NOTE — Progress Notes (Signed)
 Office: [phone]  /  Fax: [phone]  WEIGHT SUMMARY AND BIOMETRICS  Weight Lost Since Last Visit: 5lb  Weight Gained Since Last Visit: 0   Vitals Temp: 97.6 F (36.4 C) BP: 113/75 Pulse Rate: 75 SpO2: 94 %   Anthropometric Measurements Height: 5\' 2"  (1.575 m) Weight: 263 lb (119.3 kg) BMI (Calculated): 48.09 Weight at Last Visit: 268lb Weight Lost Since Last Visit: 5lb Weight Gained Since Last Visit: 0 Starting Weight: 283lb Total Weight Loss (lbs): 20 lb (9.072 kg)   Body Composition  Body Fat %: 51.5 % Fat Mass (lbs): 13.6 lbs Muscle Mass (lbs): 121.4 lbs Total Body Water  (lbs): 92.2 lbs Visceral Fat Rating : 18   Other Clinical Data Fasting: yes Labs: no Today's Visit #: 12 Starting Date: 08/22/22     HPI  Chief Complaint: OBESITY  Vinetta is here to discuss her progress with her obesity treatment plan. She is on the the Category 3 Plan and states she is following her eating plan approximately 50 % of the time. She states she is exercising 30 minutes 2 days per week.   Interval History:  Since last office visit she has lost 5 pounds.  She is trying to eat 3 meals per day and is eating a protein with each meal.  She started walking 2 weeks ago 2-3 days per week.  Her work is planning to start a walking group.  She is drinking water  daily.  Wegovy  has helped with polyphagia and cravings.    Bio impedance reviewed with patient-improving.   Has appt with GI on 10/10/23   Pharmacotherapy for weight loss: She is currently taking Wegovy  1 mg for medical weight loss.  Denies side effects.      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 Elvan Hamel.  Her highest weight prior to surgery was 340 lbs and her nadir weight after surgery was 264 lbs. She is taking a MV  (bariatric vitamin) with iron once daily and Vit D with calcium TID.  Prediabetes Last A1c was 5.8  Medication(s): Wegovy  1.0 mg SQ weekly-stopped Metformin  after her last visit. Denies side effects.  Polyphagia:No Lab Results  Component Value Date   HGBA1C 5.8 (H) 08/22/2022   HGBA1C 6.3 (H) 06/30/2020   Lab Results  Component Value Date   INSULIN  12.6 08/22/2022    PHYSICAL EXAM:  Blood pressure 113/75, pulse 75, temperature 97.6 F (36.4 C), height 5\' 2"  (1.575 m), weight 263 lb (119.3 kg), last menstrual period 08/27/2023, SpO2 94%. Body mass index is 48.1 kg/m.  General: She is overweight, cooperative, alert, well developed, and in no acute distress. PSYCH: Has normal mood, affect and thought process.   Extremities: No edema.  Neurologic: No gross sensory or motor deficits. No tremors or fasciculations noted.    DIAGNOSTIC DATA REVIEWED:  BMET    Component Value Date/Time   NA 136 07/27/2023 1227   NA 136 08/22/2022 0957   K 3.9 07/27/2023 1227   CL 103 07/27/2023 1227   CO2 23 07/27/2023 1227   GLUCOSE 79 07/27/2023 1227   BUN 14 07/27/2023 1227   BUN 11 08/22/2022 0957   CREATININE 0.83 07/27/2023 1227   CALCIUM 9.2 07/27/2023 1227   GFRNONAA >60 07/27/2023 1227   GFRAA >60 09/08/2014 2006   Lab Results  Component Value Date   HGBA1C 5.8 (H) 08/22/2022   HGBA1C 6.3 (H) 06/30/2020   Lab Results  Component Value Date   INSULIN  12.6 08/22/2022   Lab Results  Component Value Date   TSH 1.700 08/22/2022   CBC    Component Value Date/Time   WBC 6.9 07/27/2023 1227   RBC 4.13 07/27/2023 1227   HGB 12.7 07/27/2023 1227   HCT 39.4 07/27/2023 1227   PLT 264 07/27/2023 1227   MCV 95.4 07/27/2023 1227   MCH 30.8 07/27/2023 1227   MCHC 32.2 07/27/2023 1227   RDW 13.9 07/27/2023 1227   Iron Studies No results found for: "IRON", "TIBC", "FERRITIN", "IRONPCTSAT" Lipid Panel     Component Value Date/Time   CHOL 149 08/22/2022 0957   TRIG 78  08/22/2022 0957   HDL 69 08/22/2022 0957   LDLCALC 65 08/22/2022 0957   Hepatic Function Panel     Component Value Date/Time   PROT 7.2 07/27/2023 1227   PROT 7.1 08/22/2022 0957   ALBUMIN 3.7 07/27/2023 1227   ALBUMIN 4.2 08/22/2022 0957   AST 19 07/27/2023 1227   ALT 14 07/27/2023 1227   ALKPHOS 59 07/27/2023 1227   BILITOT 0.8 07/27/2023 1227   BILITOT 0.7 08/22/2022 0957   BILIDIR 0.5 (H) 08/17/2020 0826   IBILI 1.0 (H) 08/17/2020 0826      Component Value Date/Time   TSH 1.700 08/22/2022 0957   Nutritional Lab Results  Component Value Date   VD25OH 37.6 08/22/2022     ASSESSMENT AND PLAN  TREATMENT PLAN FOR OBESITY:  Recommended Dietary Goals  Sanika is currently in the action stage of change. As such, her goal is to continue weight management plan. She has agreed to the Category 3 Plan.  Behavioral Intervention  We discussed the following Behavioral Modification Strategies today: increasing lean protein intake to established goals, decreasing simple carbohydrates , increasing vegetables, increasing fiber rich foods, increasing water  intake , reading food labels , keeping healthy foods at home, and continue to work on maintaining a reduced calorie state, getting the recommended amount of protein, incorporating whole foods, making healthy choices, staying well hydrated and practicing mindfulness when eating..  Additional resources provided today: NA  Recommended Physical Activity Goals  Misheel has been advised to work up to 150 minutes of moderate intensity aerobic activity a week and strengthening exercises 2-3 times per week for cardiovascular health, weight loss maintenance and preservation of muscle mass.   She has agreed to Think about enjoyable ways to increase daily physical activity and overcoming barriers to exercise, Increase physical activity in their day and reduce sedentary time (increase NEAT)., and continue to gradually increase the amount and  intensity of exercise routine   Pharmacotherapy We discussed various medication options to help Leeasia with her weight loss efforts and we both agreed to increase Wegovy  1.7mg . Side effects discussed.  ASSOCIATED CONDITIONS ADDRESSED TODAY  Action/Plan  Pre-diabetes Doing well.  Will continue to monitor  Continue Wegovy  as directed  Postoperative malabsorption -     Hemoglobin A1c -     Vitamin B1 -     PTH, intact and calcium -     Prealbumin -     Vitamin B12 -     Folate -     Ferritin -     VITAMIN D  25 Hydroxy (Vit-D Deficiency, Fractures) -     TSH -     Lipid Panel With LDL/HDL Ratio -     Comprehensive metabolic  panel with GFR -     CBC with Differential/Platelet  Class 3 severe obesity due to excess calories with serious comorbidity and body mass index (BMI) of 45.0 to 49.9 in adult -     Wegovy ; Inject 1.7 mg into the skin once a week.  Dispense: 3 mL; Refill: 0      Keep appt with GI   Return in about 4 weeks (around 10/23/2023).Aaron Aas She was informed of the importance of frequent follow up visits to maximize her success with intensive lifestyle modifications for her multiple health conditions.   ATTESTASTION STATEMENTS:  Reviewed by clinician on day of visit: allergies, medications, problem list, medical history, surgical history, family history, social history, and previous encounter notes.     Crist Dominion. Jermia Rigsby FNP-C

## 2023-09-30 LAB — HEMOGLOBIN A1C
Est. average glucose Bld gHb Est-mCnc: 100 mg/dL
Hgb A1c MFr Bld: 5.1 % (ref 4.8–5.6)

## 2023-09-30 LAB — CBC WITH DIFFERENTIAL/PLATELET
Basophils Absolute: 0 10*3/uL (ref 0.0–0.2)
Basos: 1 %
EOS (ABSOLUTE): 0.3 10*3/uL (ref 0.0–0.4)
Eos: 4 %
Hematocrit: 42.5 % (ref 34.0–46.6)
Hemoglobin: 13.5 g/dL (ref 11.1–15.9)
Immature Grans (Abs): 0 10*3/uL (ref 0.0–0.1)
Immature Granulocytes: 0 %
Lymphocytes Absolute: 2.9 10*3/uL (ref 0.7–3.1)
Lymphs: 45 %
MCH: 30.5 pg (ref 26.6–33.0)
MCHC: 31.8 g/dL (ref 31.5–35.7)
MCV: 96 fL (ref 79–97)
Monocytes Absolute: 0.3 10*3/uL (ref 0.1–0.9)
Monocytes: 5 %
Neutrophils Absolute: 2.9 10*3/uL (ref 1.4–7.0)
Neutrophils: 45 %
Platelets: 251 10*3/uL (ref 150–450)
RBC: 4.43 x10E6/uL (ref 3.77–5.28)
RDW: 13 % (ref 11.7–15.4)
WBC: 6.4 10*3/uL (ref 3.4–10.8)

## 2023-09-30 LAB — COMPREHENSIVE METABOLIC PANEL WITH GFR
ALT: 13 IU/L (ref 0–32)
AST: 19 IU/L (ref 0–40)
Albumin: 4.2 g/dL (ref 3.9–4.9)
Alkaline Phosphatase: 83 IU/L (ref 44–121)
BUN/Creatinine Ratio: 14 (ref 9–23)
BUN: 12 mg/dL (ref 6–24)
Bilirubin Total: 0.6 mg/dL (ref 0.0–1.2)
CO2: 22 mmol/L (ref 20–29)
Calcium: 9.7 mg/dL (ref 8.7–10.2)
Chloride: 105 mmol/L (ref 96–106)
Creatinine, Ser: 0.86 mg/dL (ref 0.57–1.00)
Globulin, Total: 2.8 g/dL (ref 1.5–4.5)
Glucose: 83 mg/dL (ref 70–99)
Potassium: 4.6 mmol/L (ref 3.5–5.2)
Sodium: 143 mmol/L (ref 134–144)
Total Protein: 7 g/dL (ref 6.0–8.5)
eGFR: 84 mL/min/{1.73_m2} (ref >59)

## 2023-09-30 LAB — VITAMIN D 25 HYDROXY (VIT D DEFICIENCY, FRACTURES): Vit D, 25-Hydroxy: 38.6 ng/mL (ref 30.0–100.0)

## 2023-09-30 LAB — PTH, INTACT AND CALCIUM: PTH: 14 pg/mL — ABNORMAL LOW (ref 15–65)

## 2023-09-30 LAB — FERRITIN: Ferritin: 45 ng/mL (ref 15–150)

## 2023-09-30 LAB — LIPID PANEL WITH LDL/HDL RATIO
Cholesterol, Total: 142 mg/dL (ref 100–199)
HDL: 60 mg/dL (ref >39)
LDL Chol Calc (NIH): 63 mg/dL (ref 0–99)
LDL/HDL Ratio: 1.1 ratio (ref 0.0–3.2)
Triglycerides: 103 mg/dL (ref 0–149)
VLDL Cholesterol Cal: 19 mg/dL (ref 5–40)

## 2023-09-30 LAB — PREALBUMIN: PREALBUMIN: 21 mg/dL (ref 12–34)

## 2023-09-30 LAB — VITAMIN B12: Vitamin B-12: 1094 pg/mL (ref 232–1245)

## 2023-09-30 LAB — VITAMIN B1: Thiamine: 132 nmol/L (ref 66.5–200.0)

## 2023-09-30 LAB — FOLATE: Folate: 12.7 ng/mL (ref >3.0)

## 2023-09-30 LAB — TSH: TSH: 1.44 u[IU]/mL (ref 0.450–4.500)

## 2023-10-10 ENCOUNTER — Ambulatory Visit: Admitting: Nurse Practitioner

## 2023-10-10 ENCOUNTER — Encounter: Payer: Self-pay | Admitting: Nurse Practitioner

## 2023-10-10 VITALS — BP 122/68 | HR 79 | Ht 62.0 in | Wt 266.0 lb

## 2023-10-10 DIAGNOSIS — K219 Gastro-esophageal reflux disease without esophagitis: Secondary | ICD-10-CM | POA: Diagnosis not present

## 2023-10-10 DIAGNOSIS — K5909 Other constipation: Secondary | ICD-10-CM | POA: Diagnosis not present

## 2023-10-10 DIAGNOSIS — R1013 Epigastric pain: Secondary | ICD-10-CM

## 2023-10-10 DIAGNOSIS — R6881 Early satiety: Secondary | ICD-10-CM

## 2023-10-10 DIAGNOSIS — Z860101 Personal history of adenomatous and serrated colon polyps: Secondary | ICD-10-CM

## 2023-10-10 MED ORDER — OMEPRAZOLE 40 MG PO CPDR: 40.0000 mg | DELAYED_RELEASE_CAPSULE | Freq: Every day | ORAL | 1 refills | Status: DC

## 2023-10-10 NOTE — Progress Notes (Signed)
 Again    10/10/2023 HARLOWE DOWLER [id number] 1976-02-13   CHIEF COMPLAINT: Upper abdominal pain, gets full easily   HISTORY OF PRESENT ILLNESS: Nannie M. 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  Past partial hysterectomy she presents to our office today as referred by Stephanie Tickerhoff for further evaluation regarding abdominal pain. She endorses having epigastric pain which started 6 months post Roux en-Y bypass surgery. Her epigastric pain is often worse after eating, sometimes more noticeable after eating bread or pasta and can last for 45 minutes and goes away if she burps a lot or walks around. She has frequent nausea and infrequent vomiting. She last vomited 2 weeks ago, vomited up food she just ate. No coffee-ground hematemesis. She has rare heartburn and no dysphagia. No NSAID use. She often feels full after eating 2 bites. She cannot recall if she underwent an upper endoscopy prior to her gastric bypass surgery. She has chronic constipation for which she takes a fiber supplement, MiraLAX and Linzess daily as prescribed by her PCP which results in passing a bowel movement once weekly. No bloody or black stools. She underwent an abdominal/pelvic CT 07/27/2023 which showed a normal gallbladder with evidence of mild diffuse hepatic steatosis. She underwent a colonoscopy at [facility]: 10/06/2018 which identified one 5 mm sessile serrated polyp removed from the ascending colon.  No known family history of colon polyps or colorectal cancer.  She is on Wegovy  for weight loss.     Latest Ref Rng & Units 09/25/2023    8:23 AM 07/27/2023   12:27 PM 09/11/2021    1:13 AM  CBC  WBC 3.4 - 10.8 x10E3/uL 6.4  6.9  9.4   Hemoglobin 11.1 - 15.9 g/dL 86.4  87.2  87.2    Hematocrit 34.0 - 46.6 % 42.5  39.4  39.5   Platelets 150 - 450 x10E3/uL 251  264  243        Latest Ref Rng & Units 09/25/2023    8:23 AM 07/27/2023   12:27 PM 08/22/2022    9:57 AM  CMP  Glucose 70 - 99 mg/dL 83  79  91   BUN 6 - 24 mg/dL 12  14  11    Creatinine 0.57 - 1.00 mg/dL 9.13  9.16  9.16   Sodium 134 - 144 mmol/L 143  136  136   Potassium 3.5 - 5.2 mmol/L 4.6  3.9  4.6   Chloride 96 - 106 mmol/L 105  103  99   CO2 20 - 29 mmol/L 22  23  23    Calcium 8.7 - 10.2 mg/dL 9.7  9.2  9.1   Total Protein 6.0 - 8.5 g/dL 7.0  7.2  7.1   Total Bilirubin 0.0 - 1.2 mg/dL 0.6  0.8  0.7   Alkaline Phos 44 - 121 IU/L 83  59  88   AST 0 - 40 IU/L 19  19  19    ALT 0 - 32 IU/L 13  14  15      Colonoscopy 10/10/2018 at [facility]: Findings:  5 mm sessile polyp present in the ascending colon, removed with cold  snare.  Otherwise normal appearing colonic mucosa throughout.  Normal retroflexion in the rectum.  Recommendations:  Awaiting Pathology. Preliminary recommendations:  Repeat in 7 years  Report:  Ascending colon, polypectomies. Fragments of sessile serrated lesion, negative for dysplasia  Past Medical History:  Diagnosis Date   Anxiety    Asthma    Back pain    Bipolar 1 disorder (HCC)    Chest pain    Constipation    Depression    Diabetes (HCC)    DM (diabetes mellitus) (HCC)    type 2    Dyslipidemia    GERD (gastroesophageal reflux disease)    High blood pressure    High cholesterol    History of kidney stones    Hypertension    Joint pain    Migraines    hx of not recent    Obesity    Pneumonia    hx of as a baby    Right knee pain    SOB (shortness of breath)    Past Surgical History:  Procedure Laterality Date   BREAT BIOPSY     CESAREAN SECTION     GASTRIC ROUX-EN-Y N/A 07/07/2020   Procedure: LAPAROSCOPIC ROUX-EN-Y GASTRIC BYPASS WITH UPPER ENDOSCOPY;  Surgeon: Tanda Locus, MD;  Location: [facility];  Service: General;  Laterality: N/A;  3 HOURS   MOUTH  SURGERY     PARTIAL HYSTERECTOMY     right carpal tunnel release      UPPER GI ENDOSCOPY N/A 07/07/2020   Procedure: UPPER GI ENDOSCOPY;  Surgeon: Tanda Locus, MD;  Location: [facility];  Service: General;  Laterality: N/A;   Social History: She is married.  She has 1 son and 1 daughter.  She is a direct Ecologist.  Non-smoker.  No alcohol  use.  No drug use.  Family History: No known family history of esophageal, gastric or colorectal cancer.  Mother with hypertension, hypercholesterolemia, diabetes, depression and kidney disease.  Allergies  Allergen Reactions   Shellfish Allergy Anaphylaxis   Nickel Rash   Tramadol Hives and Nausea Only   Latex Rash      Outpatient Encounter Medications as of 10/10/2023  Medication Sig   acetaminophen  (TYLENOL ) 500 MG tablet Take 1,000 mg by mouth every 6 (six) hours as needed for mild pain.   albuterol  (VENTOLIN  HFA) 108 (90 Base) MCG/ACT inhaler Inhale 2 puffs into the lungs every 4 (four) hours as needed.   calcium-vitamin D  (OSCAL WITH D) 500-200 MG-UNIT tablet Take 1 tablet by mouth in the morning, at noon, and at bedtime.   LINZESS 72 MCG capsule Take by mouth.   Multiple Vitamins-Minerals (BARIATRIC MULTIVITAMINS/IRON PO) Take 1 tablet by mouth daily.   ondansetron  (ZOFRAN -ODT) 4 MG disintegrating tablet Take 4 mg by mouth every 8 (eight) hours as needed.   Semaglutide -Weight Management (WEGOVY ) 1.7 MG/0.75ML SOAJ Inject 1.7 mg into the skin once a week.   triamcinolone ointment (KENALOG) 0.1 % Apply 1 application  topically daily as needed (eczema).   No facility-administered encounter medications on file as of 10/10/2023.   REVIEW OF SYSTEMS:  Gen: Denies fever, sweats or chills. No unintentional weight loss.  CV:  + Left foot swelling. No CP. Resp: Denies cough, shortness of breath of hemoptysis.  GI: See HPI.  GU: + Urine leakage.  MS: + Back pain. Derm: Denies rash, itchiness, skin lesions or unhealing ulcers. Psych: +  Anxiety. Heme: Denies bruising, easy bleeding. Neuro: + Headaches. Endo: + Past diabetes type II, resolved after weight loss.   PHYSICAL EXAM: Ht 5' 2 (1.575 m)   Wt 266 lb (120.7 kg)   BMI 48.65 kg/m  General: 49 year old female in  no acute distress. Head: Normocephalic and atraumatic. Eyes:  Sclerae non-icteric, conjunctive pink. Ears: Normal auditory acuity. Mouth: Dentition intact. No ulcers or lesions.  Neck: Supple, no lymphadenopathy or thyromegaly.  Lungs: Clear bilaterally to auscultation without wheezes, crackles or rhonchi. Heart: Regular rate and rhythm. No murmur, rub or gallop appreciated.  Abdomen: Soft, nontender, nondistended. No masses. No hepatosplenomegaly. Normoactive bowel sounds x 4 quadrants.  Rectal: Deferred.  Musculoskeletal: Symmetrical with no gross deformities. Skin: Warm and dry. No rash or lesions on visible extremities. Extremities: No edema. Neurological: Alert oriented x 4, no focal deficits.  Psychological: Alert and cooperative. Normal mood and affect.  ASSESSMENT AND PLAN:  48 year old female with a history of GERD that is post Roux-en-Y gastric bypass surgery 06/2020 with chronic epigastric pain and intermittent nausea with rare vomiting.  -Omeprazole 40mg  one capsule po to be taken 30 minutes before breakfast  -Recommend eating 3 to 4 small snack size meals  -EGD benefits and risks discussed including risk with sedation, risk of bleeding, perforation and infection  -Patient instructed to follow-up with OB for 1 week prior to her EGD date  Chronic constipation  -Patient continue fiber supplement, MiraLAX daily and Linzess 72 mcg once daily -May increase Linzess to 145mcg once daily -Dulcolax 1 to 2 tabs every third night as needed - Fiber diet as tolerated - Drink 64 ounces of water  daily  History of colon polyps. Colonoscopy at [facility]: 10/06/2018 which identified one 5 mm sessile serrated polyp removed from the ascending colon. - Dr.  Stacia to verify colonoscopy recall date         CC:  [facility]*

## 2023-10-10 NOTE — Patient Instructions (Addendum)
 we have sent the following medications to your pharmacy for you to pick up at your convenience: Omeprazole 40mg , take 1 capsule daily.  Eat 3-4 small snack sized meals daily.  You have been scheduled for an endoscopy. Please follow written instructions given to you at your visit today.  If you use inhalers (even only as needed), please bring them with you on the day of your procedure.  If you take any of the following medications, they will need to be adjusted prior to your procedure:   DO NOT TAKE 7 DAYS PRIOR TO TEST- Trulicity (dulaglutide) Ozempic, Wegovy  (semaglutide ) Mounjaro (tirzepatide) Bydureon Bcise (exanatide extended release)  DO NOT TAKE 1 DAY PRIOR TO YOUR TEST Rybelsus (semaglutide ) Adlyxin (lixisenatide) Victoza (liraglutide) Byetta (exanatide) ___________________________________________________________________________   Thank you for trusting me with your gastrointestinal care!   Elida Shawl, CRNP   _______________________________________________________  If your blood pressure at your visit was 140/90 or greater, please contact your primary care physician to follow up on this.  _______________________________________________________  If you are age 65 or older, your body mass index should be between 23-30. Your Body mass index is 48.65 kg/m. If this is out of the aforementioned range listed, please consider follow up with your Primary Care Provider.  If you are age 27 or younger, your body mass index should be between 19-25. Your Body mass index is 48.65 kg/m. If this is out of the aformentioned range listed, please consider follow up with your Primary Care Provider.   ________________________________________________________  The [facility] providers would like to encourage you to use MYCHART to communicate with providers for non-urgent requests or questions.  Due to long hold times on the telephone, sending your provider a message by [facility]  may be a faster and more efficient way to get a response.  Please allow 48 business hours for a response.  Please remember that this is for non-urgent requests.  _______________________________________________________

## 2023-10-11 NOTE — Progress Notes (Signed)
 Heavenly, pls enter recall colonoscopy due June 2027 thx

## 2023-10-11 NOTE — Progress Notes (Signed)
 Agree with the assessment and plan as outlined by Elida Shawl, NP.   Guidelines recommend repeat colonoscopy in 5-10 following a subcentimeter SSP.  I typically recommend 7 years.   Ferlin Fairhurst E. Stacia, MD [facility]

## 2023-10-19 ENCOUNTER — Encounter: Payer: Self-pay | Admitting: Gastroenterology

## 2023-10-19 ENCOUNTER — Ambulatory Visit: Admitting: Gastroenterology

## 2023-10-19 VITALS — BP 134/84 | HR 63 | Temp 98.2°F | Resp 21 | Ht 62.0 in | Wt 266.0 lb

## 2023-10-19 DIAGNOSIS — R1013 Epigastric pain: Secondary | ICD-10-CM

## 2023-10-19 DIAGNOSIS — Z980 Intestinal bypass and anastomosis status: Secondary | ICD-10-CM | POA: Diagnosis not present

## 2023-10-19 MED ORDER — SODIUM CHLORIDE 0.9 % IV SOLN: 500.0000 mL | Freq: Once | INTRAVENOUS | Status: DC

## 2023-10-19 NOTE — Op Note (Signed)
 [facility] Patient Name: Gloria Kim Procedure Date: 10/19/2023 8:01 AM MRN: [id number] Endoscopist: Glendia E. Stacia , MD, [id number] Age: 48 Referring MD:  Date of Birth: [birth year] Gender: Female Account #: [id number] Procedure:                Upper GI endoscopy Indications:              Epigastric abdominal pain, Early satiety, Nausea                            with vomiting Medicines:                Monitored Anesthesia Care Procedure:                Pre-Anesthesia Assessment:                           - Prior to the procedure, a History and Physical                            was performed, and patient medications and                            allergies were reviewed. The patient's tolerance of                            previous anesthesia was also reviewed. The risks                            and benefits of the procedure and the sedation                            options and risks were discussed with the patient.                            All questions were answered, and informed consent                            was obtained. Prior Anticoagulants: The patient has                            taken no anticoagulant or antiplatelet agents. ASA                            Grade Assessment: III - A patient with severe                            systemic disease. After reviewing the risks and                            benefits, the patient was deemed in satisfactory                            condition to undergo the procedure.  After obtaining informed consent, the endoscope was                            passed under direct vision. Throughout the                            procedure, the patient's blood pressure, pulse, and                            oxygen saturations were monitored continuously. The                            GIF [id number] #[id number] was introduced through the                            mouth, and advanced to the efferent  jejunal loop.                            The upper GI endoscopy was accomplished without                            difficulty. The patient tolerated the procedure                            well. Scope In: Scope Out: Findings:                 The examined portions of the nasopharynx,                            oropharynx and larynx were normal.                           The examined esophagus was normal.                           Evidence of a Roux-en-Y gastrojejunostomy was                            found. The gastrojejunal anastomosis was widely                            patent (>2cm in diameter) and characterized by                            healthy appearing mucosa. A few surgical staples                            were visible at the anastomosis, but no ulcers,                            inflammation or friability. The gastric pouch was                            enlarged, 6-7 cm in length.  Normal mucosa was found in the entire examined                            gastric pouch. Biopsies were taken with a cold                            forceps for Helicobacter pylori testing. Estimated                            blood loss was minimal.                           The examined jejunum was normal. Complications:            No immediate complications. Estimated Blood Loss:     Estimated blood loss was minimal. Impression:               - The examined portions of the nasopharynx,                            oropharynx and larynx were normal.                           - Normal esophagus.                           - Roux-en-Y gastrojejunostomy with gastrojejunal                            anastomosis characterized by healthy appearing                            mucosa.                           - Normal mucosa was found in the entire stomach.                            Biopsied.                           - Normal examined jejunum.                           -  Suspect patient's upper GI symptoms may be                            related to constipation Recommendation:           - Patient has a contact number available for                            emergencies. The signs and symptoms of potential                            delayed complications were discussed with the  patient. Return to normal activities tomorrow.                            Written discharge instructions were provided to the                            patient.                           - Resume previous diet.                           - Continue present medications.                           - Await pathology results.                           - Continue to adjust laxative regimen to improve                            stool frequency/regularity. Gomer France E. Stacia, MD 10/19/2023 8:15:26 AM This report has been signed electronically.

## 2023-10-19 NOTE — Patient Instructions (Signed)
 Resume previous diet Continue present medications Await pathology results Continue to adjust laxative regimen to improve stool frequency/regularity     YOU HAD AN ENDOSCOPIC PROCEDURE TODAY AT THE [facility]:   Refer to the procedure report that was given to you for any specific questions about what was found during the examination.  If the procedure report does not answer your questions, please call your gastroenterologist to clarify.  If you requested that your care partner not be given the details of your procedure findings, then the procedure report has been included in a sealed envelope for you to review at your convenience later.  YOU SHOULD EXPECT: Some feelings of bloating in the abdomen. Passage of more gas than usual.  Walking can help get rid of the air that was put into your GI tract during the procedure and reduce the bloating. If you had a lower endoscopy (such as a colonoscopy or flexible sigmoidoscopy) you may notice spotting of blood in your stool or on the toilet paper. If you underwent a bowel prep for your procedure, you may not have a normal bowel movement for a few days.  Please Note:  You might notice some irritation and congestion in your nose or some drainage.  This is from the oxygen used during your procedure.  There is no need for concern and it should clear up in a day or so.  SYMPTOMS TO REPORT IMMEDIATELY:   Following upper endoscopy (EGD)  Vomiting of blood or coffee ground material  New chest pain or pain under the shoulder blades  Painful or persistently difficult swallowing  New shortness of breath  Fever of 100F or higher  Black, tarry-looking stools  For urgent or emergent issues, a gastroenterologist can be reached at any hour by calling [phone]. Do not use MyChart messaging for urgent concerns.    DIET:  We do recommend a small meal at first, but then you may proceed to your regular diet.  Drink plenty of fluids but you should  avoid alcoholic beverages for 24 hours.  ACTIVITY:  You should plan to take it easy for the rest of today and you should NOT DRIVE or use heavy machinery until tomorrow (because of the sedation medicines used during the test).    FOLLOW UP: Our staff will call the number listed on your records the next business day following your procedure.  We will call around 7:15- 8:00 am to check on you and address any questions or concerns that you may have regarding the information given to you following your procedure. If we do not reach you, we will leave a message.     If any biopsies were taken you will be contacted by phone or by letter within the next 1-3 weeks.  Please call us  at [phone] if you have not heard about the biopsies in 3 weeks.    SIGNATURES/CONFIDENTIALITY: You and/or your care partner have signed paperwork which will be entered into your electronic medical record.  These signatures attest to the fact that that the information above on your After Visit Summary has been reviewed and is understood.  Full responsibility of the confidentiality of this discharge information lies with you and/or your care-partner.

## 2023-10-19 NOTE — Progress Notes (Signed)
 Sedate, gd SR, tolerated procedure well, VSS, report to RN

## 2023-10-19 NOTE — Progress Notes (Signed)
 History and Physical Interval Note:  10/19/2023 7:51 AM  Gloria Kim  has presented today for endoscopic procedure(s), with the diagnosis of  Encounter Diagnosis  Name Primary?   Epigastric abdominal pain Yes  .  The various methods of evaluation and treatment have been discussed with the patient and/or family. After consideration of risks, benefits and other options for treatment, the patient has consented to  the endoscopic procedure(s).   The patient's history has been reviewed, patient examined, no change in status, stable for endoscopic procedure(s).  I have reviewed the patient's chart and labs.  Questions were answered to the patient's satisfaction.     Marelin Tat E. Stacia, MD [facility]

## 2023-10-20 ENCOUNTER — Other Ambulatory Visit: Payer: Self-pay | Admitting: Nurse Practitioner

## 2023-10-20 DIAGNOSIS — E66813 Obesity, class 3: Secondary | ICD-10-CM

## 2023-10-23 ENCOUNTER — Encounter: Payer: Self-pay | Admitting: Nurse Practitioner

## 2023-10-23 ENCOUNTER — Ambulatory Visit (INDEPENDENT_AMBULATORY_CARE_PROVIDER_SITE_OTHER): Admitting: Nurse Practitioner

## 2023-10-23 ENCOUNTER — Telehealth: Payer: Self-pay | Admitting: *Deleted

## 2023-10-23 VITALS — BP 118/78 | HR 70 | Temp 97.8°F | Ht 62.0 in | Wt 261.0 lb

## 2023-10-23 DIAGNOSIS — E66813 Obesity, class 3: Secondary | ICD-10-CM

## 2023-10-23 DIAGNOSIS — R7989 Other specified abnormal findings of blood chemistry: Secondary | ICD-10-CM

## 2023-10-23 DIAGNOSIS — Z6841 Body Mass Index (BMI) 40.0 and over, adult: Secondary | ICD-10-CM

## 2023-10-23 DIAGNOSIS — R7303 Prediabetes: Secondary | ICD-10-CM

## 2023-10-23 MED ORDER — WEGOVY 1.7 MG/0.75ML ~~LOC~~ SOAJ: 1.7000 mg | SUBCUTANEOUS | 0 refills | Status: DC

## 2023-10-23 NOTE — Progress Notes (Signed)
 Office: [phone]  /  Fax: [phone]  WEIGHT SUMMARY AND BIOMETRICS  Weight Lost Since Last Visit: 2lb  Weight Gained Since Last Visit: 0lb   Vitals Temp: 97.8 F (36.6 C) BP: 118/78 Pulse Rate: 70 SpO2: 95 %   Anthropometric Measurements Height: 5' 2 (1.575 m) Weight: 261 lb (118.4 kg) BMI (Calculated): 47.73 Weight at Last Visit: 263lb Weight Lost Since Last Visit: 2lb Weight Gained Since Last Visit: 0lb Starting Weight: 283lb Total Weight Loss (lbs): 22 lb (9.979 kg)   Body Composition  Body Fat %: 51.9 % Fat Mass (lbs): 135.8 lbs Muscle Mass (lbs): 119.6 lbs Total Body Water  (lbs): 92.6 lbs Visceral Fat Rating : 18   Other Clinical Data Fasting: Yes Labs: No Today's Visit #: 13 Starting Date: 08/22/22     HPI  Chief Complaint: OBESITY  Gloria Kim is here to discuss her progress with her obesity treatment plan. She is on the the Category 3 Plan and states she is following her eating plan approximately 50 % of the time. She states she is exercising 30-45 minutes 2 days per week.   Interval History:  Since last office visit she lost 2 pounds.  She had an EGD on 10/19/23 and hasn't been able to follow her meal plan due to the EGD. She skipped her Wegovy  for 2 weeks.  She started walking 3 weeks ago.    Pharmacotherapy for weight loss: She is currently taking Wegovy  1.7 mg for medical weight loss.  Denies side effects.      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 Tanda.  Her highest weight prior to surgery was 340 lbs and her nadir weight after surgery was 264 lbs. She is taking a MV (bariatric vitamin) with iron once daily and Vit D with calcium TID.   Prediabetes Last A1c was 5.1  Medication(s): Wegovy  1.7 SQ weekly Polyphagia:Yes Lab Results   Component Value Date   HGBA1C 5.1 09/25/2023   HGBA1C 5.8 (H) 08/22/2022   HGBA1C 6.3 (H) 06/30/2020   Lab Results  Component Value Date   INSULIN  12.6 08/22/2022   PHYSICAL EXAM:  Blood pressure 118/78, pulse 70, temperature 97.8 F (36.6 C), height 5' 2 (1.575 m), weight 261 lb (118.4 kg), last menstrual period 10/19/2023, SpO2 95%. Body mass index is 47.74 kg/m.  General: She is overweight, cooperative, alert, well developed, and in no acute distress. PSYCH: Has normal mood, affect and thought process.   Extremities: No edema.  Neurologic: No gross sensory or motor deficits. No tremors or fasciculations noted.    DIAGNOSTIC DATA REVIEWED:  BMET    Component Value Date/Time   NA 143 09/25/2023 0823   K 4.6 09/25/2023 0823   CL 105 09/25/2023 0823   CO2 22 09/25/2023 0823   GLUCOSE 83 09/25/2023 0823   GLUCOSE 79 07/27/2023 1227   BUN 12 09/25/2023 0823   CREATININE 0.86 09/25/2023 0823   CALCIUM 9.7 09/25/2023 0823   GFRNONAA >60 07/27/2023 1227   GFRAA >60 09/08/2014 2006   Lab Results  Component Value Date   HGBA1C 5.1 09/25/2023   HGBA1C 6.3 (H) 06/30/2020   Lab Results  Component Value Date   INSULIN  12.6 08/22/2022   Lab Results  Component Value Date   TSH 1.440 09/25/2023   CBC  Component Value Date/Time   WBC 6.4 09/25/2023 0823   WBC 6.9 07/27/2023 1227   RBC 4.43 09/25/2023 0823   RBC 4.13 07/27/2023 1227   HGB 13.5 09/25/2023 0823   HCT 42.5 09/25/2023 0823   PLT 251 09/25/2023 0823   MCV 96 09/25/2023 0823   MCH 30.5 09/25/2023 0823   MCH 30.8 07/27/2023 1227   MCHC 31.8 09/25/2023 0823   MCHC 32.2 07/27/2023 1227   RDW 13.0 09/25/2023 0823   Iron Studies    Component Value Date/Time   FERRITIN 45 09/25/2023 0823   Lipid Panel     Component Value Date/Time   CHOL 142 09/25/2023 0823   TRIG 103 09/25/2023 0823   HDL 60 09/25/2023 0823   LDLCALC 63 09/25/2023 0823   Hepatic Function Panel     Component Value Date/Time    PROT 7.0 09/25/2023 0823   ALBUMIN 4.2 09/25/2023 0823   AST 19 09/25/2023 0823   ALT 13 09/25/2023 0823   ALKPHOS 83 09/25/2023 0823   BILITOT 0.6 09/25/2023 0823   BILIDIR 0.5 (H) 08/17/2020 0826   IBILI 1.0 (H) 08/17/2020 0826      Component Value Date/Time   TSH 1.440 09/25/2023 0823   Nutritional Lab Results  Component Value Date   VD25OH 38.6 09/25/2023   VD25OH 37.6 08/22/2022     ASSESSMENT AND PLAN  TREATMENT PLAN FOR OBESITY:  Recommended Dietary Goals  Miya is currently in the action stage of change. As such, her goal is to continue weight management plan. She has agreed to keeping a food journal and adhering to recommended goals of 1500-1600 calories and 100+ grams protein.  Track using mynetdiary and will review macros at next visit-protein, carbs and fats.    Behavioral Intervention  We discussed the following Behavioral Modification Strategies today: increasing lean protein intake to established goals, decreasing simple carbohydrates , increasing vegetables, increasing fiber rich foods, increasing water  intake , and continue to work on maintaining a reduced calorie state, getting the recommended amount of protein, incorporating whole foods, making healthy choices, staying well hydrated and practicing mindfulness when eating..  Additional resources provided today: NA  Recommended Physical Activity Goals  Shandrea has been advised to work up to 150 minutes of moderate intensity aerobic activity a week and strengthening exercises 2-3 times per week for cardiovascular health, weight loss maintenance and preservation of muscle mass.   She has agreed to Think about enjoyable ways to increase daily physical activity and overcoming barriers to exercise, Increase physical activity in their day and reduce sedentary time (increase NEAT)., and continue to gradually increase the amount and intensity of exercise routine   Pharmacotherapy We discussed various medication  options to help Deziray with her weight loss efforts and we both agreed to restart/continue Wegovy  1.7mg .  Side effects discussed.  ASSOCIATED CONDITIONS ADDRESSED TODAY  Action/Plan  Pre-diabetes Last A1c greatly improved significantly.  Continue current medications.  Will continue to monitor  Low serum parathyroid hormone (PTH) -     PTH, intact and calcium -     Magnesium  Class 3 severe obesity due to excess calories with serious comorbidity and body mass index (BMI) of 45.0 to 49.9 in adult -     Wegovy ; Inject 1.7 mg into the skin once a week.  Dispense: 3 mL; Refill: 0       Labs reviewed in chart with patient from 09/25/23  Return in about 4 weeks (around 11/20/2023).SABRA She was informed of the importance of  frequent follow up visits to maximize her success with intensive lifestyle modifications for her multiple health conditions.   ATTESTASTION STATEMENTS:  Reviewed by clinician on day of visit: allergies, medications, problem list, medical history, surgical history, family history, social history, and previous encounter notes.      Corean SAUNDERS. Tylek Boney FNP-C

## 2023-10-23 NOTE — Telephone Encounter (Signed)
 Post procedure follow up call placed, no answer and left VM.

## 2023-10-25 LAB — MAGNESIUM: Magnesium: 1.9 mg/dL (ref 1.6–2.3)

## 2023-10-25 LAB — PTH, INTACT AND CALCIUM
Calcium: 9 mg/dL (ref 8.7–10.2)
PTH: 26 pg/mL (ref 15–65)

## 2023-10-25 LAB — SURGICAL PATHOLOGY

## 2023-10-26 ENCOUNTER — Ambulatory Visit: Payer: Self-pay | Admitting: Gastroenterology

## 2023-10-26 NOTE — Progress Notes (Signed)
 Gloria Kim,  The biopsies of your stomach were normal.  There is no evidence of H. pylori infection. Please continue to adjust your laxative regimen to improve your constipation.  I am hopeful that your nausea will improve once your constipation is improved.

## 2023-11-01 ENCOUNTER — Other Ambulatory Visit: Payer: Self-pay | Admitting: Nurse Practitioner

## 2023-11-20 ENCOUNTER — Ambulatory Visit: Admitting: Bariatrics

## 2023-11-20 ENCOUNTER — Encounter: Payer: Self-pay | Admitting: Bariatrics

## 2023-11-20 VITALS — BP 135/79 | HR 82 | Temp 98.3°F | Ht 62.0 in | Wt 265.0 lb

## 2023-11-20 DIAGNOSIS — R7303 Prediabetes: Secondary | ICD-10-CM | POA: Diagnosis not present

## 2023-11-20 DIAGNOSIS — E65 Localized adiposity: Secondary | ICD-10-CM

## 2023-11-20 DIAGNOSIS — Z6841 Body Mass Index (BMI) 40.0 and over, adult: Secondary | ICD-10-CM

## 2023-11-20 DIAGNOSIS — E66813 Obesity, class 3: Secondary | ICD-10-CM

## 2023-11-20 MED ORDER — WEGOVY 1.7 MG/0.75ML ~~LOC~~ SOAJ
1.7000 mg | SUBCUTANEOUS | 0 refills | Status: DC
Start: 2023-11-20 — End: 2023-12-28

## 2023-11-20 NOTE — Progress Notes (Unsigned)
 WEIGHT SUMMARY AND BIOMETRICS  Weight Lost Since Last Visit: 0lb  Weight Gained Since Last Visit: 4lb   Vitals Temp: 98.3 F (36.8 C) BP: 135/79 Pulse Rate: 82 SpO2: 98 %   Anthropometric Measurements Height: 5' 2 (1.575 m) Weight: 265 lb (120.2 kg) BMI (Calculated): 48.46 Weight at Last Visit: 261lb Weight Lost Since Last Visit: 0lb Weight Gained Since Last Visit: 4lb Starting Weight: 283lb Total Weight Loss (lbs): 18 lb (8.165 kg)   Body Composition  Body Fat %: 53.6 % Fat Mass (lbs): 142.2 lbs Muscle Mass (lbs): 116.8 lbs Visceral Fat Rating : 19   Other Clinical Data Fasting: No Labs: No Today's Visit #: 14 Starting Date: 08/22/22    OBESITY Emalynn is here to discuss her progress with her obesity treatment plan along with follow-up of her obesity related diagnoses.    Nutrition Plan: the Category 3 plan - 50% adherence.  Current exercise: walking  Interim History:  She is up 4 lbs since her last visit.  She had been doing well. The bip-impedence scale showed that her body fat percentage is up and her muscle mass is down somewhat.  Eating all of the food on the plan., Protein intake is as prescribed, Not journaling consistently., and Water  intake is adequate.   Pharmacotherapy: Davi is on Wegovy  1.7 SQ weekly Adverse side effects: None Hunger is well controlled.  Cravings are well controlled.  Assessment/Plan:   Prediabetes Last A1c was 5.1  Medication(s): Wegovy  1.7 SQ weekly Lab Results  Component Value Date   HGBA1C 5.1 09/25/2023   HGBA1C 5.8 (H) 08/22/2022   HGBA1C 6.3 (H) 06/30/2020   Lab Results  Component Value Date   INSULIN  12.6 08/22/2022    Plan: Will minimize all refined carbohydrates both sweets and starches.  Will work on the plan and exercise.  Consider both aerobic and resistance training.  Will keep  protein, water , and fiber intake high.  Increase Polyunsaturated and Monounsaturated fats to increase satiety and encourage weight loss.  Aim for 7 to 9 hours of sleep nightly.  Will continue medications.  *** {dwwmed:29123} {dwwglp:29109}   Visceral Obesity.   She has a visceral fat rating of *** per the bio-impedence scale.   Plan: The goal is a visceral fat rating of 13 or below.  Will work on the plan and increase exercise/begin exercise.  Information sheet on  Tips to lose belly fat . Aware that belly fat may be equate to visceral fat, but many of the same tips can help both subcutaneous and visceral fat.  Information sheet on  Healthy and Unhealthy fats.  Will minimize all carbohydrates ( sweets and starches ).      {dwwmorbid:29108::Morbid Obesity}: Current BMI BMI (Calculated): 48.46   Pharmacotherapy Plan {dwwmed:29123}  {dwwpharmacotherapy:29109}  Henessy is currently in the action stage of change. As such, her goal is to  continue with weight loss efforts.  She has agreed to {dwwsldiets:29085}.  Exercise goals: All adults should avoid inactivity. Some physical activity is better than none, and adults who participate in any amount of physical activity gain some health benefits. She is using her walking pad about 2 to 3 days a week.   Behavioral modification strategies: {dwwslwtlossstrategies:29088}.  Oceanna has agreed to follow-up with our clinic in 4 weeks.   Objective:   VITALS: Per patient if applicable, see vitals. GENERAL: Alert and in no acute distress. CARDIOPULMONARY: No increased WOB. Speaking in clear sentences.  PSYCH: Pleasant and cooperative. Speech normal rate and rhythm. Affect is appropriate. Insight and judgement are appropriate. Attention is focused, linear, and appropriate.  NEURO: Oriented as arrived to appointment on time with no prompting.   Attestation Statements:   This was prepared with the assistance of Engineer, civil (consulting).   Occasional wrong-word or sound-a-like substitutions may have occurred due to the inherent limitations of voice recognition   Clayborne Daring, DO

## 2023-11-21 ENCOUNTER — Telehealth: Payer: Self-pay

## 2023-11-21 NOTE — Telephone Encounter (Signed)
 Started PA for Wegovy  1.7 mg via covermymeds

## 2023-11-22 NOTE — Telephone Encounter (Signed)
 WEGOVY  INJ 1.7MG  is approved through 11/20/2024.

## 2023-12-14 ENCOUNTER — Other Ambulatory Visit: Payer: Self-pay

## 2023-12-14 ENCOUNTER — Encounter (HOSPITAL_COMMUNITY): Payer: Self-pay

## 2023-12-14 ENCOUNTER — Emergency Department (HOSPITAL_COMMUNITY)
Admission: EM | Admit: 2023-12-14 | Discharge: 2023-12-14 | Disposition: A | Discharge status: Home / Self Care | Attending: Emergency Medicine | Admitting: Emergency Medicine

## 2023-12-14 DIAGNOSIS — R600 Localized edema: Secondary | ICD-10-CM | POA: Diagnosis not present

## 2023-12-14 DIAGNOSIS — M79605 Pain in left leg: Secondary | ICD-10-CM | POA: Insufficient documentation

## 2023-12-14 DIAGNOSIS — Z91040 Latex allergy status: Secondary | ICD-10-CM | POA: Diagnosis not present

## 2023-12-14 NOTE — ED Triage Notes (Signed)
 Patient reports leg swelling tenderness and warmth x greater than 1 week.

## 2023-12-14 NOTE — Discharge Instructions (Addendum)
 Please return to our facility for vascular ultrasound, following the attached instructions.

## 2023-12-14 NOTE — ED Provider Notes (Signed)
 [place] EMERGENCY DEPARTMENT AT [facility] Provider Note   CSN: [id number] Arrival date & time: 12/14/23  1606     Patient presents with: Leg Pain   Gloria Kim is a 48 y.o. female.   HPI   49 year old female presents emergency department with left calf pain.  Feels like the calf is slightly swollen but otherwise denies any injury, wound, redness.  No fever or systemic symptoms.  Denies any numbness or weakness.  No history of previous DVT.  Prior to Admission medications   Medication Sig Start Date End Date Taking? Authorizing Provider  acetaminophen  (TYLENOL ) 500 MG tablet Take 1,000 mg by mouth every 6 (six) hours as needed for mild pain.    [provider]  albuterol  (VENTOLIN  HFA) 108 (90 Base) MCG/ACT inhaler Inhale 2 puffs into the lungs every 4 (four) hours as needed. 07/29/21   [provider]  calcium-vitamin D  (OSCAL WITH D) 500-200 MG-UNIT tablet Take 1 tablet by mouth in the morning, at noon, and at bedtime.    [provider]  VESTA 72 MCG capsule Take by mouth. 07/06/23   [provider]  Multiple Vitamins-Minerals (BARIATRIC MULTIVITAMINS/IRON PO) Take 1 tablet by mouth daily.    [provider]  omeprazole  (PRILOSEC) 40 MG capsule TAKE 1 CAPSULE (40 MG TOTAL) BY MOUTH DAILY. 11/01/23   Kennedy-Smith, Colleen M, NP  ondansetron  (ZOFRAN -ODT) 4 MG disintegrating tablet Take 4 mg by mouth every 8 (eight) hours as needed. 04/27/23   [provider]  Semaglutide -Weight Management (WEGOVY ) 1.7 MG/0.75ML SOAJ Inject 1.7 mg into the skin once a week. 11/20/23   Delores Shields A, DO  triamcinolone ointment (KENALOG) 0.1 % Apply 1 application  topically daily as needed (eczema). 03/16/20   [provider]    Allergies: Shellfish allergy, Nickel, Tramadol, and Latex    Review of Systems  Constitutional:  Negative for fever.  Respiratory:  Negative for shortness of breath.   Cardiovascular:  Positive for  leg swelling. Negative for chest pain.  Gastrointestinal:  Negative for abdominal pain.  Skin:  Negative for rash.    Updated Vital Signs BP 102/80 (BP Location: Right Arm)   Pulse 70   Temp 97.6 F (36.4 C)   Resp 18   Ht 5' 2 (1.575 m)   Wt 119.3 kg   LMP 12/11/2023 (Approximate)   SpO2 99%   BMI 48.10 kg/m   Physical Exam Vitals and nursing note reviewed.  Constitutional:      Appearance: Normal appearance.  HENT:     Head: Normocephalic.     Mouth/Throat:     Mouth: Mucous membranes are moist.  Cardiovascular:     Rate and Rhythm: Normal rate.  Pulmonary:     Effort: Pulmonary effort is normal. No respiratory distress.  Musculoskeletal:     Comments: Left calf is possibly mildly edematous but there are no overlying skin changes, redness or cellulitis, the left foot is well-perfused and neuro intact.  Skin:    General: Skin is warm.  Neurological:     Mental Status: She is alert and oriented to person, place, and time. Mental status is at baseline.  Psychiatric:        Mood and Affect: Mood normal.     (all labs ordered are listed, but only abnormal results are displayed) Labs Reviewed - No data to display  EKG: None  Radiology: No results found.   Procedures   Medications Ordered in the ED - No  data to display                                  Medical Decision Making  48 year old female presents emergency department with left calf pain and mild swelling.  Vitals are normal and stable.  No chest pain or shortness of breath.  No history of DVT.  No overlying skin changes or findings of cellulitis, afebrile.  The calf is relatively unremarkable, possibly slightly swollen compared to the right, no popliteal fossa pain.  No findings of acute infection, otherwise neurovascularly intact.  Will plan for outpatient DVT ultrasound, this will be ordered and she will come back tomorrow morning for this study.  Do not feel strongly enough to initiate  anticoagulation at this time.  Patient at this time appears safe and stable for discharge and close outpatient follow up. Discharge plan and strict return to ED precautions discussed, patient verbalizes understanding and agreement.     Final diagnoses:  None    ED Discharge Orders          Ordered    LE Venous       Comments: IMPORTANT PATIENT INSTRUCTIONS: You have been scheduled for an Outpatient Vascular Study at [facility].  If tomorrow is a Saturday, Sunday or holiday, please go to the [facility] Registration Desk at 11 am tomorrow morning and tell them you are there for a vascular study.  If tomorrow is a weekday (Monday-Friday), please go to the [facility] (address [address] [place]) at 8 am and report to the 4th floor registration Zone A.  Inform registration that you are there for a vascular study.   12/14/23 2340               HortonRoxie HERO, DO 12/14/23 2349

## 2023-12-14 NOTE — ED Triage Notes (Addendum)
 C/O left leg pain. Denies recent travels. Denies CP and SHOB

## 2023-12-15 ENCOUNTER — Ambulatory Visit (HOSPITAL_COMMUNITY)
Admission: RE | Admit: 2023-12-15 | Discharge: 2023-12-15 | Disposition: A | Discharge status: Home / Self Care | Source: Ambulatory Visit | Attending: Emergency Medicine | Admitting: Emergency Medicine

## 2023-12-15 DIAGNOSIS — M7989 Other specified soft tissue disorders: Secondary | ICD-10-CM | POA: Diagnosis not present

## 2023-12-15 DIAGNOSIS — R52 Pain, unspecified: Secondary | ICD-10-CM | POA: Insufficient documentation

## 2023-12-15 DIAGNOSIS — E669 Obesity, unspecified: Secondary | ICD-10-CM | POA: Insufficient documentation

## 2023-12-16 ENCOUNTER — Other Ambulatory Visit: Payer: Self-pay | Admitting: Bariatrics

## 2023-12-16 DIAGNOSIS — E66813 Obesity, class 3: Secondary | ICD-10-CM

## 2023-12-19 ENCOUNTER — Ambulatory Visit: Admitting: Nurse Practitioner

## 2023-12-28 ENCOUNTER — Telehealth: Admitting: Nurse Practitioner

## 2023-12-28 DIAGNOSIS — Z6841 Body Mass Index (BMI) 40.0 and over, adult: Secondary | ICD-10-CM

## 2023-12-28 DIAGNOSIS — R400 Somnolence: Secondary | ICD-10-CM | POA: Diagnosis not present

## 2023-12-28 DIAGNOSIS — R0683 Snoring: Secondary | ICD-10-CM | POA: Diagnosis not present

## 2023-12-28 DIAGNOSIS — E66813 Obesity, class 3: Secondary | ICD-10-CM | POA: Diagnosis not present

## 2023-12-28 MED ORDER — WEGOVY 1.7 MG/0.75ML ~~LOC~~ SOAJ: 1.7000 mg | SUBCUTANEOUS | 0 refills | Status: DC

## 2023-12-28 NOTE — Progress Notes (Signed)
 Office: [phone]  /  Fax: [phone]  WEIGHT SUMMARY AND BIOMETRICS  TeleHealth Visit:  This visit was completed with telemedicine (audio/video) technology. Alondra has verbally consented to this TeleHealth visit. The patient is located at home, the provider is located at [facility]. The participants in this visit include the listed provider and patient. The visit was conducted today via MyChart video.   HPI  Chief Complaint: OBESITY  Arpita is here via video visit to discuss her progress with her obesity treatment plan. She is on the the Category 3 Plan and states she is following her eating plan approximately 50 % of the time. She states she is exercising 0 minutes 0 days per week.   Interval History:  Reported weight:  264 lbs  She is tracking off and on or following cat 3 50% of the time.  She is unable to see her app at this time.  Will send to my via mychart after her visit to review.  She is drinking more water  and coffee with cream daily.  She is not currently exercising.  She went to the ER on 12/14/23, saw vein and vascular and saw her PCP for follow up yesterday for leg/back pain. She had x rays yesterday of her back yesterday and was started on Mobic for 10 days.  She is taking Prilosec daily.  She is considering going to PT after seeing her PCP for follow up in 2 weeks.     Pharmacotherapy for weight loss: She is currently taking Wegovy  1.7 mg for medical weight loss.  Denies side effects.      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 Tanda.  Her highest weight prior to surgery was 340 lbs and her nadir weight after surgery was 264 lbs. She is taking a MV (bariatric vitamin) with iron once daily and Vit D with calcium TID.    Snoring and daytime sleepiness Her wife notes  snoring and apneic pauses.  Patient reports daytime sleepiness.  Notes PLMS.    PHYSICAL EXAM:  Last menstrual period 12/11/2023. There is no height or weight on file to calculate BMI.  General: She is overweight, cooperative, alert, well developed, and in no acute distress. PSYCH: Has normal mood, affect and thought process.   Extremities: No edema.  Neurologic: No gross sensory or motor deficits. No tremors or fasciculations noted.    DIAGNOSTIC DATA REVIEWED:  BMET    Component Value Date/Time   NA 143 09/25/2023 0823   K 4.6 09/25/2023 0823   CL 105 09/25/2023 0823   CO2 22 09/25/2023 0823   GLUCOSE 83 09/25/2023 0823   GLUCOSE 79 07/27/2023 1227   BUN 12 09/25/2023 0823   CREATININE 0.86 09/25/2023 0823   CALCIUM 9.0 10/23/2023 0832   GFRNONAA >60 07/27/2023 1227   GFRAA >60 09/08/2014 2006   Lab Results  Component Value Date   HGBA1C 5.1 09/25/2023   HGBA1C 6.3 (H) 06/30/2020   Lab Results  Component Value Date   INSULIN  12.6 08/22/2022   Lab Results  Component Value Date   TSH 1.440 09/25/2023   CBC    Component Value Date/Time   WBC 6.4 09/25/2023 0823   WBC 6.9 07/27/2023 1227   RBC 4.43 09/25/2023 0823   RBC 4.13 07/27/2023 1227   HGB 13.5  09/25/2023 0823   HCT 42.5 09/25/2023 0823   PLT 251 09/25/2023 0823   MCV 96 09/25/2023 0823   MCH 30.5 09/25/2023 0823   MCH 30.8 07/27/2023 1227   MCHC 31.8 09/25/2023 0823   MCHC 32.2 07/27/2023 1227   RDW 13.0 09/25/2023 0823   Iron Studies    Component Value Date/Time   FERRITIN 45 09/25/2023 0823   Lipid Panel     Component Value Date/Time   CHOL 142 09/25/2023 0823   TRIG 103 09/25/2023 0823   HDL 60 09/25/2023 0823   LDLCALC 63 09/25/2023 0823   Hepatic Function Panel     Component Value Date/Time   PROT 7.0 09/25/2023 0823   ALBUMIN 4.2 09/25/2023 0823   AST 19 09/25/2023 0823   ALT 13 09/25/2023 0823   ALKPHOS 83 09/25/2023 0823   BILITOT 0.6 09/25/2023 0823   BILIDIR 0.5 (H)  08/17/2020 0826   IBILI 1.0 (H) 08/17/2020 0826      Component Value Date/Time   TSH 1.440 09/25/2023 0823   Nutritional Lab Results  Component Value Date   VD25OH 38.6 09/25/2023   VD25OH 37.6 08/22/2022     ASSESSMENT AND PLAN  TREATMENT PLAN FOR OBESITY:  Recommended Dietary Goals  Kahlan is currently in the action stage of change. As such, her goal is to continue weight management plan. She has agreed to keeping a food journal and adhering to recommended goals of 1500-1600 calories and 90+ grams protein.  Behavioral Intervention  We discussed the following Behavioral Modification Strategies today: increasing lean protein intake to established goals, decreasing simple carbohydrates , increasing vegetables, increasing fiber rich foods, increasing water  intake , reading food labels , keeping healthy foods at home, continue to work on maintaining a reduced calorie state, getting the recommended amount of protein, incorporating whole foods, making healthy choices, staying well hydrated and practicing mindfulness when eating., and increase protein intake, fibrous foods (25 grams per day for women, 30 grams for men) and water  to improve satiety and decrease hunger signals. .  Additional resources provided today: NA  Recommended Physical Activity Goals  Tikesha has been advised to work up to 150 minutes of moderate intensity aerobic activity a week and strengthening exercises 2-3 times per week for cardiovascular health, weight loss maintenance and preservation of muscle mass.   She has agreed to Unable to participate in physical activity at present due to medical conditions    Pharmacotherapy We discussed various medication options to help Chakia with her weight loss efforts and we both agreed to continue Wegovy  1.7mg .  Side effects discussed.  ASSOCIATED CONDITIONS ADDRESSED TODAY  Action/Plan  Snoring -     Pulmonary Visit for eval for OSAS  Daytime sleepiness -      Pulmonary Visit for eval for OSAS  Class 3 severe obesity due to excess calories with serious comorbidity and body mass index (BMI) of 45.0 to 49.9 in adult -     Wegovy ; Inject 1.7 mg into the skin once a week.  Dispense: 9 mL; Refill: 0 -     Pulmonary Visit         Return in about 4 weeks (around 01/25/2024).SABRA She was informed of the importance of frequent follow up visits to maximize her success with intensive lifestyle modifications for her multiple health conditions.   ATTESTASTION STATEMENTS:  Reviewed by clinician on day of visit: allergies, medications, problem list, medical history, surgical history, family history, social history, and previous encounter notes.  Corean SAUNDERS. Grant Swager FNP-C

## 2024-01-25 ENCOUNTER — Ambulatory Visit (INDEPENDENT_AMBULATORY_CARE_PROVIDER_SITE_OTHER): Admitting: Bariatrics

## 2024-01-25 ENCOUNTER — Encounter: Payer: Self-pay | Admitting: Bariatrics

## 2024-01-25 VITALS — BP 121/79 | HR 77 | Temp 97.5°F | Ht 62.0 in | Wt 257.0 lb

## 2024-01-25 DIAGNOSIS — Z6841 Body Mass Index (BMI) 40.0 and over, adult: Secondary | ICD-10-CM

## 2024-01-25 DIAGNOSIS — K5900 Constipation, unspecified: Secondary | ICD-10-CM

## 2024-01-25 DIAGNOSIS — R632 Polyphagia: Secondary | ICD-10-CM

## 2024-01-25 DIAGNOSIS — Z9884 Bariatric surgery status: Secondary | ICD-10-CM

## 2024-01-25 MED ORDER — LINZESS 72 MCG PO CAPS: 72.0000 ug | ORAL_CAPSULE | Freq: Every day | ORAL | 1 refills | Status: DC

## 2024-01-25 MED ORDER — WEGOVY 1.7 MG/0.75ML ~~LOC~~ SOAJ: 1.7000 mg | SUBCUTANEOUS | 0 refills | Status: DC

## 2024-01-25 NOTE — Progress Notes (Signed)
 WEIGHT SUMMARY AND BIOMETRICS  Weight Lost Since Last Visit: 8lb  Weight Gained Since Last Visit: 0   Vitals Temp: (!) 97.5 F (36.4 C) BP: 121/79 Pulse Rate: 77 SpO2: 96 %   Anthropometric Measurements Height: 5' 2 (1.575 m) Weight: 257 lb (116.6 kg) BMI (Calculated): 46.99 Weight at Last Visit: 265lb Weight Lost Since Last Visit: 8lb Weight Gained Since Last Visit: 0 Starting Weight: 283lb Total Weight Loss (lbs): 26 lb (11.8 kg)   Body Composition  Body Fat %: 52.6 % Fat Mass (lbs): 135.2 lbs Muscle Mass (lbs): 115.6 lbs Total Body Water  (lbs): 94.8 lbs Visceral Fat Rating : 18   Other Clinical Data Fasting: no Labs: no Today's Visit #: 16 Starting Date: 08/22/23    OBESITY Gloria Kim is here to discuss her progress with her obesity treatment plan along with follow-up of her obesity related diagnoses.    Nutrition Plan: the Category 3 plan - 50% adherence.  Current exercise: walking  Interim History:  She is down an additional 8 lbs since her last visit. She is drinking more water .  Eating all of the food on the plan., Protein intake is as prescribed, Is not skipping meals, and Water  intake is adequate.   Pharmacotherapy: Gloria Kim is on Wegovy  1.7 SQ weekly Adverse side effects: Constipation, chronic Hunger is moderately controlled.  Cravings are well controlled.  Assessment/Plan:   Polyphagia Gloria Kim endorses excessive hunger.  Medication(s): Wegovy  Effects of medication:  moderately controlled. Cravings are moderately controlled.   Plan: Medication(s): Wegovy  1.7 SQ weekly Will increase water , protein and fiber to help assuage hunger.  Will minimize foods that have a high glucose index/load to minimize reactive hypoglycemia.  Will try a stool softener and increased water .   Constipation Gloria Kim notes constipation.   This is likely  related to GLP-1 and increased protein.  She has been taking a stool softener but not effective. She has been out of Linzess.  Constipation is poorly controlled.   Plan: Increase fiber up to 25 to 30 grams of fiber.   Increase water  intake to at least 64 ounces daily.  Add MiraLAX once daily.  May take twice a day or every other day based on results. Rx: Linzess 72 mg 1 daily in the am. # 30 with 1 refill.     Morbid Obesity: Current BMI BMI (Calculated): 46.99   Pharmacotherapy Plan Continue and refill  Wegovy  1.7 SQ weekly  Gloria Kim is currently in the action stage of change. As such, her goal is to continue with weight loss efforts.  She has agreed to the Category 3 plan.  Exercise goals: All adults should avoid inactivity. Some physical activity is better than none, and adults who participate in any amount of physical activity gain some health benefits. She is active at work and walking.  Behavioral modification strategies: increasing lean protein intake, no meal  skipping, decrease eating out, meal planning , planning for success, increasing vegetables, increasing fiber rich foods, get rid of junk food in the home, decrease snacking , keep healthy foods in the home, and weigh protein portions.  Gloria Kim has agreed to follow-up with our clinic in 4 weeks.    Objective:   VITALS: Per patient if applicable, see vitals. GENERAL: Alert and in no acute distress. CARDIOPULMONARY: No increased WOB. Speaking in clear sentences.  PSYCH: Pleasant and cooperative. Speech normal rate and rhythm. Affect is appropriate. Insight and judgement are appropriate. Attention is focused, linear, and appropriate.  NEURO: Oriented as arrived to appointment on time with no prompting.   Attestation Statements:   This was prepared with the assistance of Engineer, civil (consulting).  Occasional wrong-word or sound-a-like substitutions may have occurred due to the inherent limitations of voice recognition    Clayborne Daring, DO

## 2024-01-29 ENCOUNTER — Telehealth (INDEPENDENT_AMBULATORY_CARE_PROVIDER_SITE_OTHER): Payer: Self-pay

## 2024-01-29 NOTE — Telephone Encounter (Signed)
 WE DENIED: Denied Denied Code 1 Code 2 Plan Service Description Dates Amount Wegovy  Inj 1.7mg  Medicaid 01/29/2024   Medicaid does not cover the following service(s) in the [facility] Plan:   Wegovy  Inj 1.7mg  The requested drug being used to weight loss is not covered under your plan.

## 2024-01-29 NOTE — Telephone Encounter (Signed)
 PA started for [facility]

## 2024-02-26 ENCOUNTER — Encounter: Payer: Self-pay | Admitting: Nurse Practitioner

## 2024-02-26 ENCOUNTER — Ambulatory Visit: Admitting: Nurse Practitioner

## 2024-02-26 VITALS — BP 121/76 | HR 71 | Temp 98.0°F | Ht 62.0 in | Wt 265.0 lb

## 2024-02-26 DIAGNOSIS — E66813 Obesity, class 3: Secondary | ICD-10-CM

## 2024-02-26 DIAGNOSIS — R400 Somnolence: Secondary | ICD-10-CM

## 2024-02-26 DIAGNOSIS — Z7985 Long-term (current) use of injectable non-insulin antidiabetic drugs: Secondary | ICD-10-CM

## 2024-02-26 DIAGNOSIS — K760 Fatty (change of) liver, not elsewhere classified: Secondary | ICD-10-CM | POA: Diagnosis not present

## 2024-02-26 DIAGNOSIS — R0683 Snoring: Secondary | ICD-10-CM

## 2024-02-26 DIAGNOSIS — E1159 Type 2 diabetes mellitus with other circulatory complications: Secondary | ICD-10-CM

## 2024-02-26 DIAGNOSIS — I152 Hypertension secondary to endocrine disorders: Secondary | ICD-10-CM | POA: Diagnosis not present

## 2024-02-26 DIAGNOSIS — E119 Type 2 diabetes mellitus without complications: Secondary | ICD-10-CM

## 2024-02-26 DIAGNOSIS — Z6841 Body Mass Index (BMI) 40.0 and over, adult: Secondary | ICD-10-CM

## 2024-02-26 MED ORDER — TIRZEPATIDE 2.5 MG/0.5ML ~~LOC~~ SOAJ: 2.5000 mg | SUBCUTANEOUS | 0 refills | Status: DC

## 2024-02-26 NOTE — Progress Notes (Signed)
 Office: [phone]  /  Fax: [phone]  WEIGHT SUMMARY AND BIOMETRICS  Weight Lost Since Last Visit: 0lb  Weight Gained Since Last Visit: 8lb   Vitals Temp: 98 F (36.7 C) BP: 121/76 Pulse Rate: 71 SpO2: 98 %   Anthropometric Measurements Height: 5' 2 (1.575 m) Weight: 265 lb (120.2 kg) BMI (Calculated): 48.46 Weight at Last Visit: 257lb Weight Lost Since Last Visit: 0lb Weight Gained Since Last Visit: 8lb Starting Weight: 283lb Total Weight Loss (lbs): 18 lb (8.165 kg)   Body Composition  Body Fat %: 55 % Fat Mass (lbs): 146 lbs Muscle Mass (lbs): 113.6 lbs Visceral Fat Rating : 19   Other Clinical Data Fasting: No Labs: No Today's Visit #: 17 Starting Date: 08/22/23     HPI  Chief Complaint: OBESITY  Gloria Kim is here to discuss her progress with her obesity treatment plan. She is on the the Category 3 Plan and states she is following her eating plan approximately 50 % of the time. She states she is exercising 30 minutes 1 days per week.   Interval History:  Since last office visit she has gained 8 pounds.  She is following the meal plan 50% of the time.  She is skipping breakfast.  She was substituting a protein shake but hasn't been recently due to her am schedule change.  She is also trying to watch her portion sizes and not eating too late.  She is trying to drink more water .  She drinks coffee with cream. She is walking to stay active-1-3 days per week for 30 minutes.   Notes some back pain and hasn't been walking as much as she has been.     Pharmacotherapy for weight loss: She is not currently taking medications  for medical weight loss. She stopped taking Wegovy  3 weeks ago due to cost/coverage.     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 Tanda.  Her highest weight prior to surgery was 340 lbs  and her nadir weight after surgery was 264 lbs. She is taking a MV (bariatric vitamin) with iron once daily and Vit D with calcium TID.   Snoring and daytime sleepiness  She saw Dr. Javaid on 01/23/24.  She had a PSG on 02/11/24.  She is scheduled to see pulmonary tomorrow for follow up.   Fatty liver Last CT abd was 07/27/23   Fibrosis 4 Score = 1.01 (Low risk)         Interpretation for patients with HCV          <1.45       -  F0-F1 (Low risk)          1.45-3.25 -  Indeterminate           >3.25      -  F3-F4 (High risk)     Validated for ages 16-65   Pharmacotherapy for DMT2:  She is not currently taking medications for DMT2 or HTN.  She was taking Wegovy  until 3 weeks ago and her A1cs improved on Wegovy .  However due to coverage, she is no longer able to afford Wegovy .  Last A1c was 5.1.  She has had multiple high A1cs over the years-8.2, 7.4, 7.5, 6.7.   She is not checking BS at home.   Episodes of hypoglycemia: no She has tried Metformin , Glipizide, Wegovy , Victoza in  the past.    Lab Results  Component Value Date   HGBA1C 5.1 09/25/2023   HGBA1C 5.8 (H) 08/22/2022   HGBA1C 6.3 (H) 06/30/2020   Lab Results  Component Value Date   LDLCALC 63 09/25/2023   CREATININE 0.86 09/25/2023     PHYSICAL EXAM:  Blood pressure 121/76, pulse 71, temperature 98 F (36.7 C), height 5' 2 (1.575 m), weight 265 lb (120.2 kg), last menstrual period 02/26/2024, SpO2 98%. Body mass index is 48.47 kg/m.  General: She is overweight, cooperative, alert, well developed, and in no acute distress. PSYCH: Has normal mood, affect and thought process.   Extremities: No edema.  Neurologic: No gross sensory or motor deficits. No tremors or fasciculations noted.    DIAGNOSTIC DATA REVIEWED:  BMET    Component Value Date/Time   NA 143 09/25/2023 0823   K 4.6 09/25/2023 0823   CL 105 09/25/2023 0823   CO2 22 09/25/2023 0823   GLUCOSE 83 09/25/2023 0823   GLUCOSE 79 07/27/2023 1227   BUN 12  09/25/2023 0823   CREATININE 0.86 09/25/2023 0823   CALCIUM 9.0 10/23/2023 0832   GFRNONAA >60 07/27/2023 1227   GFRAA >60 09/08/2014 2006   Lab Results  Component Value Date   HGBA1C 5.1 09/25/2023   HGBA1C 6.3 (H) 06/30/2020   Lab Results  Component Value Date   INSULIN  12.6 08/22/2022   Lab Results  Component Value Date   TSH 1.440 09/25/2023   CBC    Component Value Date/Time   WBC 6.4 09/25/2023 0823   WBC 6.9 07/27/2023 1227   RBC 4.43 09/25/2023 0823   RBC 4.13 07/27/2023 1227   HGB 13.5 09/25/2023 0823   HCT 42.5 09/25/2023 0823   PLT 251 09/25/2023 0823   MCV 96 09/25/2023 0823   MCH 30.5 09/25/2023 0823   MCH 30.8 07/27/2023 1227   MCHC 31.8 09/25/2023 0823   MCHC 32.2 07/27/2023 1227   RDW 13.0 09/25/2023 0823   Iron Studies    Component Value Date/Time   FERRITIN 45 09/25/2023 0823   Lipid Panel     Component Value Date/Time   CHOL 142 09/25/2023 0823   TRIG 103 09/25/2023 0823   HDL 60 09/25/2023 0823   LDLCALC 63 09/25/2023 0823   Hepatic Function Panel     Component Value Date/Time   PROT 7.0 09/25/2023 0823   ALBUMIN 4.2 09/25/2023 0823   AST 19 09/25/2023 0823   ALT 13 09/25/2023 0823   ALKPHOS 83 09/25/2023 0823   BILITOT 0.6 09/25/2023 0823   BILIDIR 0.5 (H) 08/17/2020 0826   IBILI 1.0 (H) 08/17/2020 0826      Component Value Date/Time   TSH 1.440 09/25/2023 0823   Nutritional Lab Results  Component Value Date   VD25OH 38.6 09/25/2023   VD25OH 37.6 08/22/2022     ASSESSMENT AND PLAN  TREATMENT PLAN FOR OBESITY:  Recommended Dietary Goals  Gloria Kim is currently in the action stage of change. As such, her goal is to continue weight management plan. She has agreed to the Category 3 Plan.  Behavioral Intervention  We discussed the following Behavioral Modification Strategies today: increasing lean protein intake to established goals, decreasing simple carbohydrates , increasing vegetables, increasing fiber rich foods,  increasing water  intake , work on meal planning and preparation, reading food labels , keeping healthy foods at home, continue to work on maintaining a reduced calorie state, getting the recommended amount of protein, incorporating whole foods, making healthy choices, staying well  hydrated and practicing mindfulness when eating., and increase protein intake, fibrous foods (25 grams per day for women, 30 grams for men) and water  to improve satiety and decrease hunger signals. .  Additional resources provided today: NA  Recommended Physical Activity Goals  Gloria Kim has been advised to work up to 150 minutes of moderate intensity aerobic activity a week and strengthening exercises 2-3 times per week for cardiovascular health, weight loss maintenance and preservation of muscle mass.   She has agreed to Think about enjoyable ways to increase daily physical activity and overcoming barriers to exercise, Increase physical activity in their day and reduce sedentary time (increase NEAT)., Start strengthening exercises with a goal of 2-3 sessions a week , Work on scheduling and tracking physical activity. , Continue to gradually increase the amount and intensity of exercise routine, Increase volume of physical activity to a goal of 240 minutes a week, and Combine aerobic and strengthening exercises for efficiency and improved cardiometabolic health.   ASSOCIATED CONDITIONS ADDRESSED TODAY  Action/Plan  Diabetes mellitus with coincident hypertension (HCC) -     Start Tirzepatide; Inject 2.5 mg into the skin once a week.  Dispense: 2 mL; Refill: 0. Side effects discussed  Contraindications:  Pancreatitis (active gallstones) Medullary thyroid cancer High triglycerides (>500)-will need labs prior to starting Multiple Endocrine Neoplasia syndrome type 2 (MEN 2) Trying to get pregnant Breastfeeding Use with caution with taking insulin  or sulfonylureas (will need to monitor blood sugars for  hypoglycemia)  Fatty liver Has seen GI in the past.  Contacted GI about follow up.  Consider elastography.    Snoring Keep follow up appt with pulmonary  Daytime sleepiness Keep follow up appt with pulmonary  Class 3 severe obesity due to excess calories with serious comorbidity and body mass index (BMI) of 45.0 to 49.9 in adult [facility])      Will obtain labs in next 1-3 months.     Return in about 4 weeks (around 03/25/2024).Gloria Kim She was informed of the importance of frequent follow up visits to maximize her success with intensive lifestyle modifications for her multiple health conditions.   ATTESTASTION STATEMENTS:  Reviewed by clinician on day of visit: allergies, medications, problem list, medical history, surgical history, family history, social history, and previous encounter notes.     Gloria Kim. Gloria Lienhard FNP-C

## 2024-02-26 NOTE — Patient Instructions (Signed)

## 2024-03-05 ENCOUNTER — Encounter: Payer: Self-pay | Admitting: Nurse Practitioner

## 2024-03-26 ENCOUNTER — Ambulatory Visit: Admitting: Nurse Practitioner

## 2024-03-26 ENCOUNTER — Encounter: Payer: Self-pay | Admitting: Nurse Practitioner

## 2024-03-26 VITALS — BP 133/83 | HR 75 | Temp 97.7°F | Ht 62.0 in | Wt 269.0 lb

## 2024-03-26 DIAGNOSIS — E119 Type 2 diabetes mellitus without complications: Secondary | ICD-10-CM

## 2024-03-26 DIAGNOSIS — Z6841 Body Mass Index (BMI) 40.0 and over, adult: Secondary | ICD-10-CM

## 2024-03-26 MED ORDER — TIRZEPATIDE 5 MG/0.5ML ~~LOC~~ SOAJ: 5.0000 mg | SUBCUTANEOUS | 0 refills | Status: DC

## 2024-03-26 NOTE — Progress Notes (Signed)
 Office: [phone]  /  Fax: [phone]  WEIGHT SUMMARY AND BIOMETRICS  Weight Lost Since Last Visit: 0lb  Weight Gained Since Last Visit: 4lb   Vitals Temp: 97.7 F (36.5 C) BP: 133/83 Pulse Rate: 75 SpO2: 98 %   Anthropometric Measurements Height: 5' 2 (1.575 m) Weight: 269 lb (122 kg) BMI (Calculated): 49.19 Weight at Last Visit: 265lb Weight Lost Since Last Visit: 0lb Weight Gained Since Last Visit: 4lb Starting Weight: 283lb Total Weight Loss (lbs): 14 lb (6.35 kg)   Body Composition  Body Fat %: 54.9 % Fat Mass (lbs): 147.6 lbs Muscle Mass (lbs): 115.2 lbs Visceral Fat Rating : 19   Other Clinical Data Fasting: No Labs: No Today's Visit #: 18 Starting Date: 08/22/22     HPI  Chief Complaint: OBESITY  Gloria Kim is here to discuss her progress with her obesity treatment plan. She is on the the Category 3 Plan and states she is following her eating plan approximately 50 % of the time. She states she is exercising 30 minutes 2-3 days per week.   Interval History:  Since last office visit she has gained 4 pounds.  She has been caring for her daughter while she was sick and hasn't been able to focus upon herself. She is trying to drink more water .  She is drinking sugar free juice and coffee daily. She has been walking to stay active.    Pharmacotherapy for weight loss: She is not currently taking medications  for medical weight loss.    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 Tanda.  Her highest weight prior to surgery was 340 lbs and her nadir weight after surgery was 264 lbs. She is taking a MV (bariatric vitamin) with iron once daily and Vit D with calcium TID.   Pharmacotherapy for DMT2:   She is currently taking Mounjaro  2.5mg  x 4 doses.  Denies side effects.   Last A1c was 5.1.  She has had  multiple high A1cs over the years-8.2, 7.4, 7.5, 6.7.   She is not checking BS at home.   Episodes of hypoglycemia: no Last eye exam:  2024 She has tried Metformin , Glipizide, Wegovy , Victoza in the past.   Lab Results  Component Value Date   HGBA1C 5.1 09/25/2023   HGBA1C 5.8 (H) 08/22/2022   HGBA1C 6.3 (H) 06/30/2020   Lab Results  Component Value Date   LDLCALC 63 09/25/2023   CREATININE 0.86 09/25/2023        PHYSICAL EXAM:  Blood pressure 133/83, pulse 75, temperature 97.7 F (36.5 C), height 5' 2 (1.575 m), weight 269 lb (122 kg), last menstrual period 02/26/2024, SpO2 98%. Body mass index is 49.2 kg/m.  General: She is overweight, cooperative, alert, well developed, and in no acute distress. PSYCH: Has normal mood, affect and thought process.   Extremities: No edema.  Neurologic: No gross sensory or motor deficits. No tremors or fasciculations noted.    DIAGNOSTIC DATA REVIEWED:  BMET    Component Value Date/Time   NA 143 09/25/2023 0823   K 4.6 09/25/2023 0823   CL 105 09/25/2023 0823   CO2 22 09/25/2023 0823   GLUCOSE 83 09/25/2023 0823   GLUCOSE 79 07/27/2023 1227   BUN 12 09/25/2023 0823   CREATININE 0.86 09/25/2023 0823   CALCIUM 9.0 10/23/2023 0832   GFRNONAA >60 07/27/2023 1227  GFRAA >60 09/08/2014 2006   Lab Results  Component Value Date   HGBA1C 5.1 09/25/2023   HGBA1C 6.3 (H) 06/30/2020   Lab Results  Component Value Date   INSULIN  12.6 08/22/2022   Lab Results  Component Value Date   TSH 1.440 09/25/2023   CBC    Component Value Date/Time   WBC 6.4 09/25/2023 0823   WBC 6.9 07/27/2023 1227   RBC 4.43 09/25/2023 0823   RBC 4.13 07/27/2023 1227   HGB 13.5 09/25/2023 0823   HCT 42.5 09/25/2023 0823   PLT 251 09/25/2023 0823   MCV 96 09/25/2023 0823   MCH 30.5 09/25/2023 0823   MCH 30.8 07/27/2023 1227   MCHC 31.8 09/25/2023 0823   MCHC 32.2 07/27/2023 1227   RDW 13.0 09/25/2023 0823   Iron Studies    Component Value  Date/Time   FERRITIN 45 09/25/2023 0823   Lipid Panel     Component Value Date/Time   CHOL 142 09/25/2023 0823   TRIG 103 09/25/2023 0823   HDL 60 09/25/2023 0823   LDLCALC 63 09/25/2023 0823   Hepatic Function Panel     Component Value Date/Time   PROT 7.0 09/25/2023 0823   ALBUMIN 4.2 09/25/2023 0823   AST 19 09/25/2023 0823   ALT 13 09/25/2023 0823   ALKPHOS 83 09/25/2023 0823   BILITOT 0.6 09/25/2023 0823   BILIDIR 0.5 (H) 08/17/2020 0826   IBILI 1.0 (H) 08/17/2020 0826      Component Value Date/Time   TSH 1.440 09/25/2023 0823   Nutritional Lab Results  Component Value Date   VD25OH 38.6 09/25/2023   VD25OH 37.6 08/22/2022     ASSESSMENT AND PLAN  TREATMENT PLAN FOR OBESITY:  Recommended Dietary Goals  Nivedita is currently in the action stage of change. As such, her goal is to continue weight management plan. She has agreed to practicing portion control and making smarter food choices, such as increasing vegetables and decreasing simple carbohydrates.  Behavioral Intervention  We discussed the following Behavioral Modification Strategies today: increasing lean protein intake to established goals, decreasing simple carbohydrates , increasing vegetables, increasing fiber rich foods, increasing water  intake , reading food labels , keeping healthy foods at home, planning for success, celebration eating strategies, continue to work on maintaining a reduced calorie state, getting the recommended amount of protein, incorporating whole foods, making healthy choices, staying well hydrated and practicing mindfulness when eating., and increase protein intake, fibrous foods (25 grams per day for women, 30 grams for men) and water  to improve satiety and decrease hunger signals. .  Additional resources provided today: NA  Recommended Physical Activity Goals  Raivyn has been advised to work up to 150 minutes of moderate intensity aerobic activity a week and strengthening  exercises 2-3 times per week for cardiovascular health, weight loss maintenance and preservation of muscle mass.   She has agreed to Think about enjoyable ways to increase daily physical activity and overcoming barriers to exercise, Increase physical activity in their day and reduce sedentary time (increase NEAT)., Continue to gradually increase the amount and intensity of exercise routine, Increase volume of physical activity to a goal of 240 minutes a week, and Combine aerobic and strengthening exercises for efficiency and improved cardiometabolic health.   ASSOCIATED CONDITIONS ADDRESSED TODAY  Action/Plan  Diabetes mellitus with coincident hypertension (HCC) -     Increase Tirzepatide ; Inject 5 mg into the skin once a week.  Dispense: 2 mL; Refill: 0.  Side effects discussed.  Class 3 severe obesity due to excess calories with serious comorbidity and body mass index (BMI) of 45.0 to 49.9 in adult [facility])         Return in about 4 weeks (around 04/23/2024).SABRA She was informed of the importance of frequent follow up visits to maximize her success with intensive lifestyle modifications for her multiple health conditions.   ATTESTASTION STATEMENTS:  Reviewed by clinician on day of visit: allergies, medications, problem list, medical history, surgical history, family history, social history, and previous encounter notes.    Corean SAUNDERS. [place] Kuang FNP-C

## 2024-04-08 ENCOUNTER — Other Ambulatory Visit: Payer: Self-pay | Admitting: Bariatrics

## 2024-04-22 ENCOUNTER — Telehealth: Admitting: Nurse Practitioner

## 2024-04-22 DIAGNOSIS — E66813 Obesity, class 3: Secondary | ICD-10-CM | POA: Diagnosis not present

## 2024-04-22 DIAGNOSIS — E119 Type 2 diabetes mellitus without complications: Secondary | ICD-10-CM

## 2024-04-22 DIAGNOSIS — Z6841 Body Mass Index (BMI) 40.0 and over, adult: Secondary | ICD-10-CM | POA: Diagnosis not present

## 2024-04-22 DIAGNOSIS — Z7985 Long-term (current) use of injectable non-insulin antidiabetic drugs: Secondary | ICD-10-CM

## 2024-04-22 MED ORDER — TIRZEPATIDE 5 MG/0.5ML ~~LOC~~ SOAJ: 5.0000 mg | SUBCUTANEOUS | 0 refills | Status: AC

## 2024-04-22 NOTE — Progress Notes (Signed)
 "  Office: [phone]  /  Fax: [phone]  WEIGHT SUMMARY AND BIOMETRICS  TeleHealth Visit:  This visit was completed with telemedicine (audio/video) technology. Gloria Kim has verbally consented to this TeleHealth visit. The patient is located at work, the provider is located at [employer]. The participants in this visit include the listed provider and patient. The visit was conducted today via MyChart video.   HPI  Chief Complaint: OBESITY  Gloria Kim is here via video visit to discuss her progress with her obesity treatment plan.    Interval History:  Reported weight:  266 lbs  She is not currently following a meal plan or tracking. She is watching her portion sizes and making healthier choices.  She purchased a portion size plates.  She is not struggling as much with cravings or polyphagia.    BF:  protein shake with frozen fruit Snack:  sometimes popcorn, almonds Lunch:  wrap with ham and cheese or turkey and cheese with mustard and lettuce and baked chips Snack:  none Dinner:  protein with a vegetable Drinks:  water , coffee, sugar free juice and occ tea  Exercise:  walking pad 2 days per week and reports she is walking at work.  Goal to start resistance training  Pharmacotherapy for weight loss: She is not currently taking medications  for medical weight loss.    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 Tanda.  Her highest weight prior to surgery was 340 lbs and her nadir weight after surgery was 264 lbs. She is taking a MV (bariatric vitamin) with iron once daily and Vit D with calcium BID (1200 calcium total).  Pharmacotherapy for DMT2:   She is currently taking Mounjaro  5mg  x 4 doses (increased after her last visit).  Denies side effects.   Last A1c was 5.1.  She has had multiple high A1cs over the years-8.2, 7.4, 7.5, 6.7.    She is not checking BS at home.   Episodes of hypoglycemia: Denies Last eye exam:  2024 She has tried Metformin , Glipizide, Wegovy , Victoza in the past.  Lab Results  Component Value Date   HGBA1C 5.1 09/25/2023   HGBA1C 5.8 (H) 08/22/2022   HGBA1C 6.3 (H) 06/30/2020   Lab Results  Component Value Date   LDLCALC 63 09/25/2023   CREATININE 0.86 09/25/2023     PHYSICAL EXAM:  There were no vitals taken for this visit. There is no height or weight on file to calculate BMI.  General: She is overweight, cooperative, alert, well developed, and in no acute distress. PSYCH: Has normal mood, affect and thought process.   Extremities: No edema.  Neurologic: No gross sensory or motor deficits. No tremors or fasciculations noted.    DIAGNOSTIC DATA REVIEWED:  BMET    Component Value Date/Time   NA 143 09/25/2023 0823   K 4.6 09/25/2023 0823   CL 105 09/25/2023 0823   CO2 22 09/25/2023 0823   GLUCOSE 83 09/25/2023 0823   GLUCOSE 79 07/27/2023 1227   BUN 12 09/25/2023 0823   CREATININE 0.86 09/25/2023 0823   CALCIUM 9.0 10/23/2023 0832   GFRNONAA >60 07/27/2023 1227   GFRAA >60 09/08/2014 2006   Lab Results  Component Value Date   HGBA1C 5.1 09/25/2023   HGBA1C 6.3 (H) 06/30/2020   Lab Results  Component Value Date   INSULIN  12.6 08/22/2022   Lab  Results  Component Value Date   TSH 1.440 09/25/2023   CBC    Component Value Date/Time   WBC 6.4 09/25/2023 0823   WBC 6.9 07/27/2023 1227   RBC 4.43 09/25/2023 0823   RBC 4.13 07/27/2023 1227   HGB 13.5 09/25/2023 0823   HCT 42.5 09/25/2023 0823   PLT 251 09/25/2023 0823   MCV 96 09/25/2023 0823   MCH 30.5 09/25/2023 0823   MCH 30.8 07/27/2023 1227   MCHC 31.8 09/25/2023 0823   MCHC 32.2 07/27/2023 1227   RDW 13.0 09/25/2023 0823   Iron Studies    Component Value Date/Time   FERRITIN 45 09/25/2023 0823   Lipid Panel     Component Value Date/Time   CHOL 142 09/25/2023 0823   TRIG 103 09/25/2023 0823    HDL 60 09/25/2023 0823   LDLCALC 63 09/25/2023 0823   Hepatic Function Panel     Component Value Date/Time   PROT 7.0 09/25/2023 0823   ALBUMIN 4.2 09/25/2023 0823   AST 19 09/25/2023 0823   ALT 13 09/25/2023 0823   ALKPHOS 83 09/25/2023 0823   BILITOT 0.6 09/25/2023 0823   BILIDIR 0.5 (H) 08/17/2020 0826   IBILI 1.0 (H) 08/17/2020 0826      Component Value Date/Time   TSH 1.440 09/25/2023 0823   Nutritional Lab Results  Component Value Date   VD25OH 38.6 09/25/2023   VD25OH 37.6 08/22/2022     ASSESSMENT AND PLAN  TREATMENT PLAN FOR OBESITY:  Recommended Dietary Goals  Gloria Kim is currently in the action stage of change. As such, her goal is to continue weight management plan. She has agreed to keeping a food journal and adhering to recommended goals of 1500-1600 calories and 90+ grams of protein.  Behavioral Intervention  We discussed the following Behavioral Modification Strategies today: increasing lean protein intake to established goals, decreasing simple carbohydrates , increasing vegetables, increasing fiber rich foods, increasing water  intake , work on meal planning and preparation, work on tracking and journaling calories using tracking application, reading food labels , keeping healthy foods at home, continue to practice mindfulness when eating, planning for success, continue to work on maintaining a reduced calorie state, getting the recommended amount of protein, incorporating whole foods, making healthy choices, staying well hydrated and practicing mindfulness when eating., and increase protein intake, fibrous foods (25 grams per day for women, 30 grams for men) and water  to improve satiety and decrease hunger signals. .  Additional resources provided today: NA  Recommended Physical Activity Goals  Massiel has been advised to work up to 150 minutes of moderate intensity aerobic activity a week and strengthening exercises 2-3 times per week for cardiovascular  health, weight loss maintenance and preservation of muscle mass.   She has agreed to Think about enjoyable ways to increase daily physical activity and overcoming barriers to exercise, Increase physical activity in their day and reduce sedentary time (increase NEAT)., Start strengthening exercises with a goal of 2-3 sessions a week , Continue to gradually increase the amount and intensity of exercise routine, Increase volume of physical activity to a goal of 240 minutes a week, and Combine aerobic and strengthening exercises for efficiency and improved cardiometabolic health.   ASSOCIATED CONDITIONS ADDRESSED TODAY  Action/Plan  Controlled type 2 diabetes mellitus without complication, without long-term current use of insulin  (HCC) -     Continue Tirzepatide ; Inject 5 mg into the skin once a week.  Dispense: 2 mL; Refill: 0.  Side effects discussed.  Class 3 severe obesity due to excess calories with serious comorbidity and body mass index (BMI) of 45.0 to 49.9 in adult [facility])         Return in about 4 weeks (around 05/20/2024).SABRA She was informed of the importance of frequent follow up visits to maximize her success with intensive lifestyle modifications for her multiple health conditions.   ATTESTASTION STATEMENTS:  Reviewed by clinician on day of visit: allergies, medications, problem list, medical history, surgical history, family history, social history, and previous encounter notes.     Corean SAUNDERS. Gussie Murton FNP-C "

## 2024-05-10 ENCOUNTER — Other Ambulatory Visit: Payer: Self-pay | Admitting: Nurse Practitioner

## 2024-05-23 ENCOUNTER — Ambulatory Visit: Admitting: Nurse Practitioner

## 2024-05-28 ENCOUNTER — Ambulatory Visit: Admitting: Nurse Practitioner
